# Patient Record
Sex: Male | Born: 1935 | Race: White | Hispanic: No | Marital: Married | Smoking: Former smoker
Health system: Southern US, Community
[De-identification: ages and names within clinical notes are randomized; demographics above are authoritative.]

## PROBLEM LIST (undated history)

## (undated) DIAGNOSIS — F329 Major depressive disorder, single episode, unspecified: Secondary | ICD-10-CM

## (undated) DIAGNOSIS — F32A Depression, unspecified: Secondary | ICD-10-CM

## (undated) DIAGNOSIS — I10 Essential (primary) hypertension: Secondary | ICD-10-CM

## (undated) DIAGNOSIS — I714 Abdominal aortic aneurysm, without rupture, unspecified: Secondary | ICD-10-CM

## (undated) DIAGNOSIS — I2510 Atherosclerotic heart disease of native coronary artery without angina pectoris: Secondary | ICD-10-CM

## (undated) DIAGNOSIS — F0390 Unspecified dementia without behavioral disturbance: Secondary | ICD-10-CM

## (undated) DIAGNOSIS — E785 Hyperlipidemia, unspecified: Secondary | ICD-10-CM

## (undated) HISTORY — DX: Atherosclerotic heart disease of native coronary artery without angina pectoris: I25.10

## (undated) HISTORY — DX: Abdominal aortic aneurysm, without rupture: I71.4

## (undated) HISTORY — PX: APPENDECTOMY: SHX54

## (undated) HISTORY — DX: Essential (primary) hypertension: I10

## (undated) HISTORY — DX: Abdominal aortic aneurysm, without rupture, unspecified: I71.40

## (undated) HISTORY — DX: Hyperlipidemia, unspecified: E78.5

---

## 1992-07-06 HISTORY — PX: CORONARY ARTERY BYPASS GRAFT: SHX141

## 2004-10-06 HISTORY — PX: ESOPHAGOGASTRODUODENOSCOPY: SHX1529

## 2004-10-06 HISTORY — PX: COLONOSCOPY: SHX174

## 2004-12-03 ENCOUNTER — Encounter (INDEPENDENT_AMBULATORY_CARE_PROVIDER_SITE_OTHER): Payer: Self-pay | Admitting: *Deleted

## 2004-12-03 ENCOUNTER — Ambulatory Visit (HOSPITAL_COMMUNITY): Admission: RE | Admit: 2004-12-03 | Discharge: 2004-12-03 | Payer: Self-pay | Admitting: Gastroenterology

## 2005-04-17 ENCOUNTER — Ambulatory Visit: Payer: Self-pay | Admitting: Cardiology

## 2005-04-25 ENCOUNTER — Ambulatory Visit: Payer: Self-pay

## 2005-04-25 ENCOUNTER — Ambulatory Visit: Payer: Self-pay | Admitting: Cardiology

## 2005-06-11 ENCOUNTER — Ambulatory Visit: Payer: Self-pay

## 2005-07-29 ENCOUNTER — Ambulatory Visit: Payer: Self-pay | Admitting: Family Medicine

## 2005-10-24 ENCOUNTER — Ambulatory Visit: Payer: Self-pay | Admitting: Cardiology

## 2006-03-31 ENCOUNTER — Ambulatory Visit: Payer: Self-pay | Admitting: Cardiology

## 2006-04-22 ENCOUNTER — Ambulatory Visit: Payer: Self-pay | Admitting: Cardiology

## 2006-05-05 ENCOUNTER — Ambulatory Visit: Payer: Self-pay | Admitting: Cardiology

## 2006-06-17 ENCOUNTER — Ambulatory Visit: Payer: Self-pay

## 2006-08-13 ENCOUNTER — Ambulatory Visit: Payer: Self-pay | Admitting: Physician Assistant

## 2006-11-04 ENCOUNTER — Ambulatory Visit: Payer: Self-pay | Admitting: Cardiology

## 2006-11-06 ENCOUNTER — Ambulatory Visit: Payer: Self-pay | Admitting: Cardiology

## 2006-11-06 LAB — CONVERTED CEMR LAB
ALT: 25 units/L (ref 0–40)
Alkaline Phosphatase: 47 units/L (ref 39–117)
BUN: 12 mg/dL (ref 6–23)
CO2: 31 meq/L (ref 19–32)
Calcium: 9.5 mg/dL (ref 8.4–10.5)
GFR calc Af Amer: 93 mL/min
GFR calc non Af Amer: 77 mL/min
LDL Cholesterol: 60 mg/dL (ref 0–99)
Total CHOL/HDL Ratio: 2.7

## 2007-02-08 ENCOUNTER — Ambulatory Visit: Payer: Self-pay | Admitting: Family Medicine

## 2007-05-20 ENCOUNTER — Ambulatory Visit: Payer: Self-pay | Admitting: Cardiology

## 2007-05-20 LAB — CONVERTED CEMR LAB
Albumin: 4 g/dL (ref 3.5–5.2)
LDL Cholesterol: 63 mg/dL (ref 0–99)
Total CHOL/HDL Ratio: 2.5
Triglycerides: 80 mg/dL (ref 0–149)
VLDL: 16 mg/dL (ref 0–40)

## 2007-06-17 ENCOUNTER — Ambulatory Visit: Payer: Self-pay

## 2007-06-17 ENCOUNTER — Emergency Department (HOSPITAL_COMMUNITY): Admission: EM | Admit: 2007-06-17 | Discharge: 2007-06-17 | Payer: Self-pay | Admitting: Emergency Medicine

## 2007-10-27 ENCOUNTER — Ambulatory Visit: Payer: Self-pay | Admitting: Cardiology

## 2008-02-02 ENCOUNTER — Ambulatory Visit: Payer: Self-pay | Admitting: Cardiology

## 2008-08-03 ENCOUNTER — Ambulatory Visit: Payer: Self-pay | Admitting: Cardiology

## 2009-01-03 ENCOUNTER — Ambulatory Visit: Payer: Self-pay | Admitting: Cardiology

## 2009-04-06 ENCOUNTER — Telehealth: Payer: Self-pay | Admitting: Cardiology

## 2009-06-04 ENCOUNTER — Telehealth (INDEPENDENT_AMBULATORY_CARE_PROVIDER_SITE_OTHER): Payer: Self-pay

## 2009-06-13 ENCOUNTER — Telehealth (INDEPENDENT_AMBULATORY_CARE_PROVIDER_SITE_OTHER): Payer: Self-pay | Admitting: *Deleted

## 2009-06-14 ENCOUNTER — Encounter: Payer: Self-pay | Admitting: Cardiology

## 2009-06-14 ENCOUNTER — Encounter (INDEPENDENT_AMBULATORY_CARE_PROVIDER_SITE_OTHER): Payer: Self-pay | Admitting: *Deleted

## 2009-06-14 ENCOUNTER — Ambulatory Visit: Payer: Self-pay

## 2009-06-14 DIAGNOSIS — I714 Abdominal aortic aneurysm, without rupture, unspecified: Secondary | ICD-10-CM | POA: Insufficient documentation

## 2009-07-12 ENCOUNTER — Encounter: Payer: Self-pay | Admitting: Cardiology

## 2009-11-20 ENCOUNTER — Encounter: Payer: Self-pay | Admitting: Cardiology

## 2009-12-12 ENCOUNTER — Encounter: Payer: Self-pay | Admitting: Cardiology

## 2009-12-13 ENCOUNTER — Ambulatory Visit: Payer: Self-pay

## 2009-12-13 ENCOUNTER — Encounter: Payer: Self-pay | Admitting: Cardiology

## 2010-06-19 ENCOUNTER — Encounter: Payer: Self-pay | Admitting: Cardiology

## 2010-06-20 ENCOUNTER — Encounter: Payer: Self-pay | Admitting: Cardiology

## 2010-06-20 ENCOUNTER — Ambulatory Visit: Payer: Self-pay

## 2010-10-21 ENCOUNTER — Encounter: Payer: Self-pay | Admitting: Cardiology

## 2010-10-23 ENCOUNTER — Encounter: Payer: Self-pay | Admitting: Cardiology

## 2010-10-23 ENCOUNTER — Ambulatory Visit
Admission: RE | Admit: 2010-10-23 | Discharge: 2010-10-23 | Payer: Self-pay | Source: Home / Self Care | Attending: Cardiology | Admitting: Cardiology

## 2010-10-23 DIAGNOSIS — I2510 Atherosclerotic heart disease of native coronary artery without angina pectoris: Secondary | ICD-10-CM | POA: Insufficient documentation

## 2010-10-23 DIAGNOSIS — E785 Hyperlipidemia, unspecified: Secondary | ICD-10-CM | POA: Insufficient documentation

## 2010-11-05 NOTE — Miscellaneous (Signed)
Summary: Orders Update  Clinical Lists Changes  Orders: Added new Test order of Abdominal Aorta Duplex (Abd Aorta Duplex) - Signed 

## 2010-11-07 NOTE — Miscellaneous (Signed)
  Clinical Lists Changes  Observations: Added new observation of ABDOM US: Stable dimensions of the juxtarenal abdominal aortic aneurysm at 4.5cm X 4.7cm Stable dimensions of the common iliac arteries  f/u 6 months (06/20/2010 14:55) Added new observation of NUCLEAR NOS: Exercise Capacity: Good exercise capacity. BP Response: Normal blood pressure response. Clinical Symptoms: No chest pain ECG Impression: No significant ST segment change suggestive of ischemia. Frequent PVCs.  Overall Impression: Small fixed apical perfusion defect represents either a small area of infarction or apical thinning.  No evidence for ischemia.  Low risk study.    (06/14/2009 14:56)      [place] of Abdomen  Procedure date:  06/20/2010  Findings:      Stable dimensions of the juxtarenal abdominal aortic aneurysm at 4.5cm X 4.7cm Stable dimensions of the common iliac arteries  f/u 6 months  Nuclear Study  Procedure date:  06/14/2009  Findings:      Exercise Capacity: Good exercise capacity. BP Response: Normal blood pressure response. Clinical Symptoms: No chest pain ECG Impression: No significant ST segment change suggestive of ischemia. Frequent PVCs.  Overall Impression: Small fixed apical perfusion defect represents either a small area of infarction or apical thinning.  No evidence for ischemia.  Low risk study.

## 2010-11-07 NOTE — Assessment & Plan Note (Signed)
Summary: Juan Patel   Visit Type:  Follow-up Primary Provider:  Dr. Ardeen Garland  CC:  CAD and AAA.  History of Present Illness: The patient presents for follow up.  Since I last saw him he has had no new cardiovascular complaints.  He is not exercising as frequently as I would hope.  He denies any chest pain, neck or arm pain. He is not having any palpitations, presyncope or syncope. He is not having any PND or orthopnea. He has had followup of his abdominal aortic aneurysm which is now being and every 6 months.  Current Medications (verified): 1)  Metoprolol Tartrate 50 Mg Tabs (Metoprolol Tartrate) .... Take 1 Tablet By Mouth Twice A Day 2)  Lipitor 10 Mg Tabs (Atorvastatin Calcium) .Marland Kitchen.. 1 By Mouth Daily 3)  Lexapro 20 Mg Tabs (Escitalopram Oxalate) .Marland Kitchen.. 1 By Mouth Daily 4)  Multivitamins   Tabs (Multiple Vitamin) .Marland Kitchen.. 1 By Mouth Daily 5)  Aspirin 81 Mg  Tabs (Aspirin) .Marland Kitchen.. 1 By Mouth Daily  Allergies (verified): No Known Drug Allergies  Past History:  Past Medical History: 1. Coronary artery disease (status post CABG with RIMA to the right     coronary artery, LIMA to the LAD, SVG to diagonal, and SVG to     circumflex in 1993). 2. Dyslipidemia. 3. Abdominal aortic aneurysm (4.1 x 4.2).   Review of Systems       As stated in the HPI and negative for all other systems.   Vital Signs:  Patient profile:   77 year old male Height:      74 inches Weight:      185 pounds BMI:     23.84 Pulse rate:   77 / minute Resp:     16 per minute BP sitting:   130 / 86  (right arm)  Vitals Entered By: Marrion Coy, CNA (October 23, 2010 1:40 PM)  Physical Exam  General:  Well developed, well nourished, in no acute distress. Head:  normocephalic and atraumatic Eyes:  PERRLA/EOM intact; conjunctiva and lids normal. Mouth:  Teeth, gums and palate normal. Oral mucosa normal. Neck:  Neck supple, no JVD. No masses, thyromegaly or abnormal cervical nodes. Chest Wall:  Well  healed sternotomy scar Lungs:  Clear bilaterally to auscultation and percussion. Heart:  S1-S2 within normal limits, no S3, no S4, 2/6 apical systolic murmur radiating slightly aortic outflow tract, early peaking,  no diastolic murmurs Abdomen:  Bowel sounds positive; abdomen soft and non-tender without masses, organomegaly, or hernias noted. No hepatosplenomegaly. Msk:  Back normal, normal gait. Muscle strength and tone normal. Pulses:  pulses normal in all 4 extremities Extremities:  No clubbing or cyanosis. Neurologic:  Alert and oriented x 3. Skin:  Intact without lesions or rashes. Cervical Nodes:  no significant adenopathy Axillary Nodes:  no significant adenopathy Inguinal Nodes:  no significant adenopathy Psych:  Normal affect.   EKG  Procedure date:  10/23/2010  Findings:      Normal sinus rhythm with premature atrial contractions, axis within normal limits, intervals within normal limits, no acute ST-T wave changes.  Impression & Recommendations:  Problem # 1:  CORONARY ATHEROSCLEROSIS NATIVE CORONARY ARTERY (ICD-414.01) He has as no new symptoms. No change in therapy is indicated.  He will continue with risk reduction. Orders: EKG w/ Interpretation (93000)  Problem # 2:  ABDOMINAL AORTIC ANEURYSM (ICD-441.4) He will be scheduled in March for follow up of this.  Problem # 3:  DYSLIPIDEMIA (ICD-272.4) He is having his  lipids followed by his primary MD.  The goal is an LDL less than 70 and HDL greater than 40.  Patient Instructions: 1)  Your physician recommends that you schedule a follow-up appointment in: 12 months with Dr Antoine Poche in [place] 2)  Your physician recommends that you continue on your current medications as directed. Please refer to the Current Medication list given to you today.

## 2010-12-24 ENCOUNTER — Other Ambulatory Visit: Payer: Self-pay | Admitting: Cardiology

## 2010-12-24 DIAGNOSIS — I714 Abdominal aortic aneurysm, without rupture: Secondary | ICD-10-CM

## 2010-12-25 ENCOUNTER — Encounter (INDEPENDENT_AMBULATORY_CARE_PROVIDER_SITE_OTHER): Payer: Medicare Other | Admitting: Cardiology

## 2010-12-25 ENCOUNTER — Encounter: Payer: Self-pay | Admitting: Cardiology

## 2010-12-25 DIAGNOSIS — I714 Abdominal aortic aneurysm, without rupture: Secondary | ICD-10-CM

## 2010-12-25 DIAGNOSIS — I723 Aneurysm of iliac artery: Secondary | ICD-10-CM

## 2011-01-08 ENCOUNTER — Encounter: Payer: Self-pay | Admitting: Cardiology

## 2011-01-09 ENCOUNTER — Telehealth: Payer: Self-pay | Admitting: *Deleted

## 2011-01-09 NOTE — Telephone Encounter (Signed)
Called pt to review results of Abdominal U/S done 3/21 - which demonstrated his AAA to be stable at 4.6 X 4.5 cm.  Pt aware to repeat in 6 months

## 2011-02-18 NOTE — Assessment & Plan Note (Signed)
[facility]                            CARDIOLOGY OFFICE NOTE   NAME:Juan Patel, Juan Patel                       MRN:          [id number]  DATE:01/03/2009                            DOB:          07/02/1926    PRIMARY CARE PHYSICIAN:  Delaney Meigs, MD   REASON FOR PRESENTATION:  Evaluate the patient with coronary artery  disease.   HISTORY OF PRESENT ILLNESS:  The patient returns for his followup.  It  has been about 15 months.  Since that time, he has done very well.  He  has not exercised quite as much as I would like though he still gets  that on the treadmill a couple of times a week.  With this level of  activity, he denies any chest discomfort, neck, or arm discomfort.  He  has had no palpitations, presyncope, or syncope.  He has had no PND or  orthopnea.  I did review lipid is made available to me by Dr. Lysbeth Galas in  January.  He had a fantastic lipid profile with a total of 120,  triglyceride 118, HDL 49, and LDL 47.  We reviewed his most recent  abdominal aortic aneurysm, which reports 4.1 x 4.2 cm.  He is slightly  overdue for followup.  It had been unchanged, however.   PAST MEDICAL HISTORY:  1. Coronary artery disease (status post CABG with RIMA to the right      coronary artery, LIMA to the LAD, SVG to diagonal, and SVG to      circumflex in 1993).  2. Dyslipidemia.  3. Abdominal aortic aneurysm (4.1 x 4.2).   ALLERGIES:  None.   MEDICATIONS:  1. Lipitor 10 mg daily.  2. Aspirin 81 mg daily.  3. Metoprolol 50 mg b.i.d.  4. Gemfibrozil 600 mg b.i.d.  5. Lexapro 20 mg daily.  6. Flomax 0.4 mg at bedtime.  7. Metformin 250 mg daily.   REVIEW OF SYSTEMS:  As stated in the HPI and otherwise negative for  other systems.   PHYSICAL EXAMINATION:  GENERAL:  The patient is pleasant in no distress.  VITAL SIGNS:  Blood pressure 140/70, heart rate 59 and regular, weight  188 pounds, and body mass index 25.  HEENT:  Eyelids unremarkable.   Pupils equal, round, and reactive to  light.  Fundi not visualized.  Oral mucosa unremarkable.  NECK:  No jugular venous distension at 45 degrees.  Carotid upstroke  brisk and symmetric.  No bruits.  No thyromegaly.  LYMPHATICS:  No cervical, axillary, or inguinal adenopathy.  LUNGS:  Clear to auscultation bilaterally.  BACK:  No costovertebral angle tenderness.  CHEST:  Well-healed sternotomy scar.  HEART:  PMI not displaced or sustained.  S1 and S2 within normal limits.  No S3, no S4.  No clicks, no rubs, no murmurs.  ABDOMEN:  Flat, positive bowel sounds, normal in frequency and pitch.  No bruits, no rebound, no guarding.  No midline pulsatile mass.  No  hepatomegaly.  No splenomegaly.  SKIN:  No rashes, no nodules.  EXTREMITIES:  2+ pulses throughout.  No  edema, no cyanosis, no clubbing.  NEUROLOGIC:  Oriented to person, place, and time.  Cranial nerves II  through XII grossly intact.  Motor grossly intact.   EKG, sinus rhythm, rate 59, axis leftward, intervals within normal  limits, no acute ST-T wave changes.   ASSESSMENT AND PLAN:  1. Coronary artery disease.  The patient is having no symptoms.  He is      undergoing aggressive secondary risk reduction.  However, his      bypass grafts are now 75 years old.  He has not had a stress      perfusion study since 2006.  I think it is reasonable to perform an      exercise perfusion study.  This will be done electively.  He will      continue with the meds as listed.  2. Abdominal aortic aneurysm.  I am going to have him do this on      around September, which will be 1 year from the previous.  He can      have his Cardiolite at the same time.  Further suggestions will be      based on the results of this.  3. Dyslipidemia.  I reviewed the lipids as above.  It is an excellent      profile and he will remain on the meds as listed.  4. Diabetes.  This is followed by Dr. Lysbeth Galas.  He actually had his      dose of metformin reduced and  I suspect he has excellent sugar      control.  He reports the sugars at home are well within normal      limits.  5. Hypertension.  Blood pressure is slightly elevated.  However, it is      otherwise controlled.  He will continue the meds as listed.  6. Followup.  I will see him back in about 18 months or sooner based      on the results of the above studies or any future symptoms.     Rollene Rotunda, MD, [facility]  Electronically Signed    JH/MedQ  DD: 01/03/2009  DT: 01/04/2009  Job #: [id number]   cc:   Delaney Meigs, M.D.

## 2011-02-18 NOTE — Assessment & Plan Note (Signed)
[facility]                            CARDIOLOGY OFFICE NOTE   NAME:Juan Patel, Juan Patel                       MRN:          [id number]  DATE:10/27/2007                            DOB:          [birth year]    PRIMARY CARE PHYSICIAN:  Dr. Ardeen Garland.   REASON FOR PRESENTATION:  Evaluate patient with coronary disease.   HISTORY OF PRESENT ILLNESS:  The patient is a pleasant 77 year old  gentleman with coronary disease, status post CABG as described below.  Since I last saw him, he has done relatively well.  He was diagnosed  with diabetes and went on a diet.  With this, he lost quite a bit of  weight.  He was also having trouble with frequent urination and has been  treated with Flomax.  During that time he stopped his  hydrochlorothiazide.  He has remained off of it, he says his blood  pressure has been excellent.  This may have to do with the weight loss.  Because of his frequent urination he was not exercising as much, but  thinks he will get back to this.  With his level of activity he denies  any chest discomfort, neck or arm discomfort.  He has not been having  any palpitations, presyncope or syncope.  Denies any PND or orthopnea.   PAST MEDICAL HISTORY:  1. Coronary artery disease (status post CABG with a RIMA to the right      coronary artery, LIMA to the LAD, SVG to diagonal and SVG to      circumflex in 1993 - the last stress perfusion study was 2006).  2. Dyslipidemia.  3. Abdominal aortic aneurysm (followed with q. 6 month ultrasound - he      is due again in March - the last measurement was 4x4.2 cm).   ALLERGIES:  None.   MEDICATIONS:  1. Lipitor 10 mg daily.  2. Multivitamin.  3. Lopid 300 mg b.i.d.  4. Toprol 100 mg daily.  5. Aspirin 81 mg daily.  6. Fish oil.  7. Glipizide.  8. Nexium.  9. Flomax.  10.Lexapro 20 mg daily.   REVIEW OF SYSTEMS:  As stated in the HPI and otherwise negative for  other systems.   PHYSICAL  EXAMINATION:  The patient is in no distress.  Blood pressure 130/76, heart rate 60 and regular, body mass index 24,  weight 177 pounds.  HEENT:  Eyelids unremarkable, pupils are equal, round, and reactive to  light, fundi not visualized.  Oral mucosa unremarkable.  NECK:  No jugular venous distension at 45 degrees, carotid upstroke  brisk and symmetrical.  No bruit.  No thyromegaly.  LYMPHATICS:  No cervical, axillary, inguinal adenopathy.  LUNGS:  Clear to auscultation bilaterally.  BACK:  No costovertebral angle tenderness.  CHEST:  Well-healed sternotomy scar.  HEART:  PMI not displaced or sustained.  S1 and S2 are within normal  limits.  No S3, no S4.  No clicks, no rubs, no murmurs.  ABDOMEN:  Flat, positive bowel sounds, normal in frequency and pitch.  No bruits, no rebound, no guarding.  No midline pulsatile mass.  No  hepatomegaly, no splenomegaly.  SKIN:  No rashes, no nodules.  EXTREMITIES:  2+ pulses throughout, no edema.   EKG sinus rhythm, rate 60, axis within normal limits, intervals within  normal limits.  No acute ST-T wave changes.   ASSESSMENT AND PLAN:  1. Coronary disease:  The patient is having no symptoms related to      this.  No further cardiovascular testing is suggested.  He will      continue the medications as listed.  2. Abdominal aortic aneurysm:  This is due to be followed up as stated      above.  3. Dyslipidemia:  He had an excellent lipid profile, and is due to      have this followed in February by Dr. Lysbeth Galas.  4. Hypertension:  Blood pressure is actually at an acceptable range      without the hydrochlorothiazide.  Therefore, he will remain on the      medicines as listed.  5. Followup:  I have encouraged him to get back to exercising now that      his urinary frequency is less bothersome.  I will see him back in      12 months or sooner if needed.     Rollene Rotunda, MD, [facility]  Electronically Signed    JH/MedQ  DD: 10/27/2007  DT:  10/27/2007  Job #: [id number]   cc:   Delaney Meigs, M.D.

## 2011-02-21 NOTE — Assessment & Plan Note (Signed)
[facility]                            CARDIOLOGY OFFICE NOTE   NAME:Juan Patel, Juan Patel                       MRN:          [id number]  DATE:11/04/2006                            DOB:          04/05/1926    PRIMARY CARE PHYSICIAN:  Delaney Meigs, M.D.   REASON FOR PRESENTATION:  Evaluate patient with coronary disease.   HISTORY OF PRESENT ILLNESS:  The patient is now 77 years old.  He was  not scheduled to come back until July.  However, he had multiple  questions, given recent articles about statins and Zetia.  He takes  Lipitor.  He has had some problem with impotency, which started shortly  after I prescribed hydrochlorothiazide.  However, this has improved and  currently he does not think it is a big issue.  He does say his blood  pressure is better.  When he is relaxed, it is in the 120s.  He thinks  he averages 130s.  When he is stressed, it may be in the 140s or 150s  systolic.  He has not had any presyncope or syncope.  He denies any  chest discomfort, neck discomfort, arm discomfort, activity-induced  nausea, vomiting, excessive diaphoresis.  He has had no shortness of  breath, PND or orthopnea.   PAST MEDICAL HISTORY:  Coronary artery disease (status post CABG with a  RIMA to the right coronary artery, LIMA to the LAD, SVG to diagonal and  SVG to circumflex in 1993), dyslipidemia, abdominal aortic aneurysm, 3.7  x 3.9.   ALLERGIES:  None.   MEDICATIONS:  1. Lipitor 10 mg daily.  2. Multivitamin.  3. Lopid 200 mg b.i.d.  4. Toprol 100 mg daily.  5. Aspirin 81 mg daily.  6. Fish oil.  7. Hydrochlorothiazide 12.5 mg daily.   REVIEW OF SYSTEMS:  As stated in the HPI, and otherwise negative for  other systems.   PHYSICAL EXAMINATION:  The patient is in no distress.  Blood pressure  148/88, heart rate 66 and regular, weight 191 pounds, body mass index  25.  HEENT:  Eyelids unremarkable.  Pupils equal, round and reactive to  light.   Fundi not visualized.  NECK:  No jugular venous distention at 45 degrees.  Carotid upstroke  brisk and symmetric.  No bruits, no thyromegaly.  LYMPHATICS:  No cervical, axillary or inguinal adenopathy.  LUNGS:  Clear to auscultation bilaterally.  BACK:  No costovertebral angle tenderness.  CHEST:  Unremarkable.  HEART:  PMI not displaced or sustained.  S1 and S2 within normal limits.  No S3, no S4, no murmurs.  ABDOMEN:  Flat, positive bowel sounds, normal in frequency and pitch.  No bruits, rebound, guarding, midline pulsatile masses, no organomegaly.  SKIN:  No rashes, no nodules.  EXTREMITIES:  2+ pulses throughout, no edema, no cyanosis or clubbing.  NEUROLOGIC:  Oriented to person, place and time.  Cranial nerves II  through XII grossly intact.  Motor grossly intact.   EKG:  Sinus rhythm with premature atrial contractions and premature  ventricular contractions, axis within normal limits, intervals within  normal  limits, no acute STT-wave changes.   ASSESSMENT AND PLAN:  1. Coronary disease:  The patient is having no symptoms related to      this.  I spent a long time (greater than half of this appointment)      discussing statins and data.  He understands and asked excellent      questions.  He agrees to continue with his current regimen.  He has      had liver enzymes and lipids checked and they have been at an      acceptable target.  His liver enzymes have not been elevated.  2. Carotid plaques/abdominal aortic aneurysm:  He is due to have      followup for this in September.  3. Dyslipidemia, as above.  4. Hypertension:  I think his blood pressure is probably at an      acceptable range, on average.  Because I think he would have more      side effects if I tried to increase doses, I will leave things as      they are.  5. Followup:  I will see him back in September of next year.  He is      going to get a BMET again and followup of his meds.     Rollene Rotunda, MD,  [facility]  Electronically Signed    JH/MedQ  DD: 11/04/2006  DT: 11/04/2006  Job #: [id number]   cc:   Delaney Meigs, M.D.

## 2011-02-21 NOTE — Op Note (Signed)
NAME:  Juan Patel, Juan Patel                ACCOUNT NO.:  [id number]   MEDICAL RECORD NO.:  [id number]          PATIENT TYPE:  AMB   LOCATION:  ENDO                         FACILITY:  [facility]   PHYSICIAN:  Bernette Redbird, M.D.   DATE OF BIRTH:  03/10/1926   DATE OF PROCEDURE:  12/03/2004  DATE OF DISCHARGE:                                 OPERATIVE REPORT   PROCEDURE:  Upper endoscopy.   INDICATION:  Intensifying reflux symptoms in a 77 year old gentleman.   FINDINGS:  Duodenal erosions consistent with aspirin. Small hiatal hernia.   PROCEDURE:  The nature, purpose and risks of the procedure had been  discussed with the patient who provided written consent. Sedation for this  procedure and the colonoscopy which followed it totaled fentanyl 70 mcg and  Versed 7 milligrams IV without arrhythmias or desaturation. The Olympus  small-caliber adult video endoscope was passed under direct vision. The  vocal cords looked normal and the esophagus was readily entered and was  normal in its entirety, specifically without evidence of free reflux, reflux  esophagitis, Barrett's esophagus, varices, infection or neoplasia. A small 2  cm hiatal hernia was present with a widely patent esophageal mucosal ring at  the squamocolumnar junction.   The stomach contained no significant residual and had essentially normal  mucosa without evidence of gastritis, erosions, ulcers, polyps or masses  including a retroflexed view of the cardia which was essentially normal.   The pylorus and duodenal bulb looked normal but the second duodenum had a  focal erosion on a fold, probably secondary to aspirin exposure. No frank  ulceration was seen.   The scope was removed from the patient. No biopsies were obtained. The  patient tolerated the procedure well and there no apparent complications.   IMPRESSION:  1.  Reflux symptoms, without worrisome findings on current examination      (409811).  2.  Small hiatal hernia with  esophageal ring.  3.  Duodenal erosion.   PLAN:  P.r.n. use of H2 blockers and/or PPIs as needed to control symptoms.      RB/MEDQ  D:  12/03/2004  T:  12/03/2004  Job:  [id number]   cc:   Delaney Meigs, M.D.  [address]  [place]  [place] [address]  Fax: [phone]

## 2011-02-21 NOTE — Op Note (Signed)
NAME:  Juan Patel, Juan Patel                ACCOUNT NO.:  [id number]   MEDICAL RECORD NO.:  [id number]          PATIENT TYPE:  AMB   LOCATION:  ENDO                         FACILITY:  [facility]   PHYSICIAN:  Bernette Redbird, M.D.   DATE OF BIRTH:  12/06/1925   DATE OF PROCEDURE:  12/03/2004  DATE OF DISCHARGE:                                 OPERATIVE REPORT   PROCEDURE:  Colonoscopy with biopsy.   INDICATIONS:  A 77 year old gentleman for first ever screening colonoscopy.  No worrisome risk factors or symptoms.   FINDINGS:  Diminutive cecal polyp.   PROCEDURE:  The nature, purpose, risks the procedure had been discussed with  the patient, who provided written consent. Sedation for this procedure. The  upper endoscopy which preceded it totaled fentanyl 70 mcg and Versed 7  milligrams IV without arrhythmias or desaturation. Digital exam of the  prostate showed a fairly flat prostate bed, no discrete nodules. The Olympus  video colonoscope was advanced to the cecum as identified by clear  visualization of the appendiceal orifice and the absence of further lumen.  Adjacent to the appendiceal orifice was a diminutive 2 mm hyperplastic  appearing polyp removed by a single cold biopsy. No other polyps were seen  and there was no evidence of cancer, colitis, vascular malformations or  significant diverticular disease that I recall. Retroflexion the rectum was  unremarkable as was reinspection of the rectum by recollection.   There were no apparent complications. The patient tolerated the procedure  well.   IMPRESSION:  1.  Diminutive cecal polyp (2113).  2.  Otherwise unremarkable screening colonoscopy.   Plan await pathology of polyp with consideration for follow-up colonoscopy  in 4 to 5 years if it is adenomatous in character. Otherwise, consideration  might be given to flexible sigmoidoscopy in 5 years for continued colon  cancer screening.      RB/MEDQ  D:  12/03/2004  T:  12/03/2004   Job:  [id number]   cc:   Delaney Meigs, M.D.  [address]  [place]  [place] [address]  Fax: [phone]

## 2011-02-21 NOTE — Assessment & Plan Note (Signed)
[facility]                              CARDIOLOGY OFFICE NOTE   NAME:Juan Patel, Juan Patel                       MRN:          [id number]  DATE:04/22/2006                            DOB:          [birth year]    PRIMARY CARE PHYSICIAN:  Delaney Meigs, MD   REASON FOR PRESENTATION:  Patient with coronary disease.   HISTORY OF PRESENT ILLNESS:  Patient is now 77 years old.  He has done well  since I last saw him.  He has had bypass surgery in 1993.  He had a negative  stress perfusion study last year with a well preserved ejection fraction.   In the past year, he has done well.  He exercises about three times a week.  He denies any chest discomfort, neck discomfort, arm discomfort, activity-  induced nausea or vomiting, excessive diaphoresis.  He has had no  palpitations, presyncope, or syncope.  He denies any PND or orthopnea.   Of note, his blood pressure is slightly elevated today.  I reviewed records  and asked him, and he says his systolic is typically above 140 in the 150s  often.   PAST MEDICAL HISTORY:  1.  Coronary artery disease (status post CABG with RIMA to the right      coronary artery, LIMA to the LAD, SVG to diagonal, and SVG to circumflex      in 1993).  2.  Hyperlipidemia.  3.  Abdominal aortic aneurysm, 3.7 x 3.9, minimal carotid plaque.   ALLERGIES:  None.   MEDICATIONS:  1.  Lipitor 10 mg daily.  2.  Multivitamin.  3.  Lopid 200 mg b.i.d.  4.  Toprol XL 100 mg daily.  5.  Aspirin 81 mg daily.  6.  Fish oil.   REVIEW OF SYSTEMS:  As stated in the HPI, otherwise negative for other  systems.   PHYSICAL EXAMINATION:  VITAL SIGNS:  Blood pressure 154/88, heart rate 71  and regular, weight 191 pounds.  Body Mass Index 25.  GENERAL:  Patient is in no distress.  HEENT:  Eyelids unremarkable, pupils are equal, round and reactive to light,  fundi not visualized, oral mucosa unremarkable.  NECK:  No jugular venous distention.   Wave form within normal limits.  Carotid upstroke brisk and symmetric.  No bruits.  No thyromegaly.  LYMPHATICS:  No cervical, axillary, inguinal adenopathy.  LUNGS:  Clear to auscultation bilaterally.  BACK:  No costovertebral angle tenderness.  CHEST:  Unremarkable.  HEART:  PMI not displaced or sustained, S1 and S2 within normal limits, no  S3, no S4, no murmurs.  ABDOMEN:  Flat, positive bowel sounds.  Normal in frequency and pitch.  No  bruits, rebound, guarding.  There are no midline pulsatile masses.  No  organomegaly.  SKIN:  No rashes, no nodules.  EXTREMITIES:  Pulses 2+ throughout, no cyanosis, no clubbing, no edema.  NEUROLOGIC:  Alert and oriented to person, place, and time.  Cranial nerves  II-XII grossly intact.  Motor grossly intact throughout.   EKG:  Sinus rhythm, rate 71, axis within normal  limits, intervals within  normal limits.  Premature atrial contractions.  No acute ST-T wave changes.   LABS:  Cholesterol 135, HDL 42.8, LDL 73, triglycerides 96.   ASSESSMENT/PLAN:  1.  Coronary disease:  Patient is having no new symptoms.  He has been      getting along very well over the past year.  I encouraged more exercise.      He has not indication for further cardiovascular study at this point.      He will continue aggressive risk reduction.  2.  Carotid plaque/abdominal aortic aneurysm:  He will have repeat, as      suggested in September.  His abdominal aneurysm was increasing slightly,      and we will keep an eye on this.  3.  Dyslipidemia:  He has the results as described above.  He also had      normal liver enzymes.  He will continue on the current regimen.  4.  Hypertension:  Blood pressure is not at target.  Therefore, I took the      liberty of adding hydrochlorothiazide 12.5 mg daily.  He will get a BMET      in about 10 days.  He will increase citrus fruits and bananas.  5.  Followup:  I will see him back in one year or sooner if needed.                                Rollene Rotunda, MD, [facility]    JH/MedQ  DD:  04/22/2006  DT:  04/22/2006  Job #:  [id number]   cc:   Delaney Meigs, MD

## 2011-05-15 ENCOUNTER — Encounter: Payer: Self-pay | Admitting: Cardiology

## 2011-05-22 ENCOUNTER — Other Ambulatory Visit (HOSPITAL_COMMUNITY): Payer: Self-pay | Admitting: Family Medicine

## 2011-05-22 DIAGNOSIS — G459 Transient cerebral ischemic attack, unspecified: Secondary | ICD-10-CM

## 2011-05-26 ENCOUNTER — Encounter (HOSPITAL_COMMUNITY): Payer: Self-pay

## 2011-05-26 ENCOUNTER — Ambulatory Visit (HOSPITAL_COMMUNITY)
Admission: RE | Admit: 2011-05-26 | Discharge: 2011-05-26 | Disposition: A | Discharge status: Home / Self Care | Payer: Medicare Other | Source: Ambulatory Visit | Attending: Family Medicine | Admitting: Family Medicine

## 2011-05-26 DIAGNOSIS — I672 Cerebral atherosclerosis: Secondary | ICD-10-CM | POA: Insufficient documentation

## 2011-05-26 DIAGNOSIS — F29 Unspecified psychosis not due to a substance or known physiological condition: Secondary | ICD-10-CM | POA: Insufficient documentation

## 2011-05-26 DIAGNOSIS — R9409 Abnormal results of other function studies of central nervous system: Secondary | ICD-10-CM | POA: Insufficient documentation

## 2011-05-26 DIAGNOSIS — G459 Transient cerebral ischemic attack, unspecified: Secondary | ICD-10-CM

## 2011-05-28 ENCOUNTER — Ambulatory Visit (HOSPITAL_COMMUNITY): Payer: Medicare Other

## 2011-06-03 ENCOUNTER — Telehealth: Payer: Self-pay | Admitting: Cardiology

## 2011-06-03 NOTE — Telephone Encounter (Signed)
Needs to review with primary MD and might need a neurology appt.

## 2011-06-03 NOTE — Telephone Encounter (Signed)
Pt wants to talk to pam re his radiology report.

## 2011-06-03 NOTE — Telephone Encounter (Signed)
Spoke with pt who states he had an episode where after taking a nap - he woke and per his wife was very confused.  EMS was called and they wanted him to go to the ED however the pt refused.  He saw Dr Lysbeth Galas the next day who ordered a CT of the head, carotid doppler and 2 D Echo.  He went for the CT but cancelled the carotid doppler and echo.  He has not heard about the CT results and wanted to see Dr Antoine Poche had reviewed it.  Explained to pt that the results would go back to the MD who ordered the CT.   Prior to speaking to pt, the wife discussed with me at length about how the pt was incoherent after waking from a nap. She states that he did not have any trouble smiling and no weakness or numbness on either side and no slurred speech.  His speech was repetitive and "he was just saying words that didn't go together".  She reports that he has been increasingly more forgetful, unreasonable and sleeping a great deal.  Wife states he sleeps 10 to 11 hours a night and naps about 3 times a day.  She did not want the pt to know that she spoke with me about the pts problems. Will forward information to Dr Antoine Poche for his review and recommendations.

## 2011-06-04 NOTE — Telephone Encounter (Signed)
PT AWARE OF RECOMMENDATIONS TO FOLLOW UP WITH DR Lysbeth Galas.  PT WILL FOLLOW UP WITH THEM.

## 2011-07-04 ENCOUNTER — Ambulatory Visit (INDEPENDENT_AMBULATORY_CARE_PROVIDER_SITE_OTHER): Payer: Medicare Other | Admitting: Cardiology

## 2011-07-04 ENCOUNTER — Encounter: Payer: Self-pay | Admitting: Cardiology

## 2011-07-04 DIAGNOSIS — R011 Cardiac murmur, unspecified: Secondary | ICD-10-CM | POA: Insufficient documentation

## 2011-07-04 DIAGNOSIS — I10 Essential (primary) hypertension: Secondary | ICD-10-CM | POA: Insufficient documentation

## 2011-07-04 DIAGNOSIS — I2510 Atherosclerotic heart disease of native coronary artery without angina pectoris: Secondary | ICD-10-CM

## 2011-07-04 DIAGNOSIS — F29 Unspecified psychosis not due to a substance or known physiological condition: Secondary | ICD-10-CM

## 2011-07-04 DIAGNOSIS — R413 Other amnesia: Secondary | ICD-10-CM

## 2011-07-04 DIAGNOSIS — G459 Transient cerebral ischemic attack, unspecified: Secondary | ICD-10-CM

## 2011-07-04 DIAGNOSIS — I714 Abdominal aortic aneurysm, without rupture: Secondary | ICD-10-CM

## 2011-07-04 DIAGNOSIS — E785 Hyperlipidemia, unspecified: Secondary | ICD-10-CM

## 2011-07-04 DIAGNOSIS — R410 Disorientation, unspecified: Secondary | ICD-10-CM | POA: Insufficient documentation

## 2011-07-04 HISTORY — DX: Essential (primary) hypertension: I10

## 2011-07-04 NOTE — Assessment & Plan Note (Signed)
The blood pressure is at target. No change in medications is indicated. We will continue with therapeutic lifestyle changes (TLC).  

## 2011-07-04 NOTE — Assessment & Plan Note (Signed)
He is due for follow up of this in October.

## 2011-07-04 NOTE — Assessment & Plan Note (Signed)
The patient has no new sypmtoms.  No further cardiovascular testing is indicated.  We will continue with aggressive risk reduction and meds as listed.  He had a low risk stress test in 9/10.

## 2011-07-04 NOTE — Patient Instructions (Signed)
Your physician has requested that you have a carotid duplex. This test is an ultrasound of the carotid arteries in your neck. It looks at blood flow through these arteries that supply the brain with blood. Allow one hour for this exam. There are no restrictions or special instructions.  Your physician has requested that you have an echocardiogram. Echocardiography is a painless test that uses sound waves to create images of your heart. It provides your doctor with information about the size and shape of your heart and how well your heart's chambers and valves are working. This procedure takes approximately one hour. There are no restrictions for this procedure.  Your physician has requested that you have an abdominal aorta duplex. During this test, an ultrasound is used to evaluate the aorta. Allow 30 minutes for this exam. Do not eat after midnight the day before and avoid carbonated beverages  Continue medications as listed.  Follow up in [place] after all testing is completed.

## 2011-07-04 NOTE — Progress Notes (Signed)
HPI The patient presents for followup of his coronary artery disease. His wife reports that in August he had an episode of confusion and difficulty finding words. He called 911. He refused admission to the hospital. His symptoms subsequently resolved over a few hours. Head CT done in August demonstrated advanced white matter disease suggestive of a vascular etiology. He is scheduled to have carotids. His wife reports that he's had increasing confusion with activity such as driving recently. She has taken over this job. He has had some leg weakness with activities and has not been as active as previous.  The patient denies any new symptoms such as chest discomfort, neck or arm discomfort. There has been no new shortness of breath, PND or orthopnea. There have been no reported palpitations, presyncope or syncope.  She does report increased sleeping.  There have been no complaints of decreased vision or motor activity.  No Known Allergies  Current Outpatient Prescriptions  Medication Sig Dispense Refill  . aspirin 325 MG tablet Take 325 mg by mouth daily.        Marland Kitchen atorvastatin (LIPITOR) 10 MG tablet Take 10 mg by mouth daily.        Marland Kitchen escitalopram (LEXAPRO) 20 MG tablet Take 20 mg by mouth daily.        . metoprolol (LOPRESSOR) 50 MG tablet Take 50 mg by mouth 2 (two) times daily.        . Multiple Vitamin (MULTIVITAMIN) tablet Take 1 tablet by mouth daily.          Past Medical History  Diagnosis Date  . CAD (coronary artery disease)     S/P CABG   . Dyslipidemia   . Abdominal aortic aneurysm     4.1 x 4.2  . Diabetes mellitus     Past Surgical History  Procedure Date  . Coronary artery bypass graft 1993    RIMA to the RCA, LIMA to the LAD, SVG to diagonal, and SVG to circumflex    ROS:  As stated in the HPI and negative for all other systems.  PHYSICAL EXAM BP 150/74  Pulse 60  Ht 6\' 2"  (1.88 m)  Wt 186 lb (84.369 kg)  BMI 23.88 kg/m2 GENERAL:  Well appearing HEENT:  Pupils  equal round and reactive, fundi not visualized, oral mucosa unremarkable NECK:  No jugular venous distention, waveform within normal limits, carotid upstroke brisk and symmetric, no bruits, no thyromegaly LYMPHATICS:  No cervical, inguinal adenopathy LUNGS:  Clear to auscultation bilaterally BACK:  No CVA tenderness CHEST:  Well healed sternotomy scar. HEART:  PMI not displaced or sustained,S1 and S2 within normal limits, no S3, no S4, no clicks, no rubs, apical systolic murmur early peaking ABD:  Flat, positive bowel sounds normal in frequency in pitch, no bruits, no rebound, no guarding, no midline pulsatile mass, no hepatomegaly, no splenomegaly EXT:  2 plus pulses throughout, no edema, no cyanosis no clubbing SKIN:  No rashes no nodules NEURO:  Cranial nerves II through XII grossly intact, motor grossly intact throughout PSYCH:  Cognitively intact, oriented to person place and time  EKG:  NSR, rate 60, axis WNL, no acute ST T wave changes.  ASSESSMENT AND PLAN

## 2011-07-04 NOTE — Assessment & Plan Note (Signed)
I will defer follow up to Josue Hector, MD

## 2011-07-04 NOTE — Assessment & Plan Note (Signed)
I will order an echo to evaluate this.

## 2011-07-04 NOTE — Assessment & Plan Note (Signed)
He might be having vascular dementia.  I will check carotid dopplers.  I will facilitate a neurology appt.

## 2011-07-10 ENCOUNTER — Encounter: Payer: Self-pay | Admitting: *Deleted

## 2011-07-18 LAB — RAPID URINE DRUG SCREEN, HOSP PERFORMED
Amphetamines: NOT DETECTED
Barbiturates: NOT DETECTED
Benzodiazepines: NOT DETECTED
Tetrahydrocannabinol: NOT DETECTED

## 2011-07-18 LAB — DIFFERENTIAL
Eosinophils Absolute: 0.2
Eosinophils Relative: 2
Lymphocytes Relative: 24
Lymphs Abs: 2.6
Monocytes Relative: 8

## 2011-07-18 LAB — CBC
HCT: 43
MCHC: 34.5
MCV: 98.8
Platelets: 312
RDW: 12.3

## 2011-07-18 LAB — BASIC METABOLIC PANEL
BUN: 13
Chloride: 94 — ABNORMAL LOW
GFR calc non Af Amer: >60
Glucose, Bld: 94
Potassium: 4.5

## 2011-07-23 ENCOUNTER — Other Ambulatory Visit: Payer: Self-pay | Admitting: Cardiology

## 2011-07-23 DIAGNOSIS — I714 Abdominal aortic aneurysm, without rupture: Secondary | ICD-10-CM

## 2011-07-24 ENCOUNTER — Encounter: Payer: Self-pay | Admitting: Cardiology

## 2011-07-24 ENCOUNTER — Encounter (INDEPENDENT_AMBULATORY_CARE_PROVIDER_SITE_OTHER): Payer: Medicare Other | Admitting: Cardiology

## 2011-07-24 ENCOUNTER — Encounter: Payer: Self-pay | Admitting: Neurology

## 2011-07-24 ENCOUNTER — Ambulatory Visit (INDEPENDENT_AMBULATORY_CARE_PROVIDER_SITE_OTHER): Payer: Medicare Other | Admitting: Neurology

## 2011-07-24 ENCOUNTER — Ambulatory Visit (HOSPITAL_COMMUNITY): Payer: Medicare Other | Attending: Cardiology

## 2011-07-24 DIAGNOSIS — E785 Hyperlipidemia, unspecified: Secondary | ICD-10-CM | POA: Insufficient documentation

## 2011-07-24 DIAGNOSIS — I079 Rheumatic tricuspid valve disease, unspecified: Secondary | ICD-10-CM | POA: Insufficient documentation

## 2011-07-24 DIAGNOSIS — R930 Abnormal findings on diagnostic imaging of skull and head, not elsewhere classified: Secondary | ICD-10-CM

## 2011-07-24 DIAGNOSIS — I714 Abdominal aortic aneurysm, without rupture: Secondary | ICD-10-CM

## 2011-07-24 DIAGNOSIS — R413 Other amnesia: Secondary | ICD-10-CM

## 2011-07-24 DIAGNOSIS — F29 Unspecified psychosis not due to a substance or known physiological condition: Secondary | ICD-10-CM

## 2011-07-24 DIAGNOSIS — R011 Cardiac murmur, unspecified: Secondary | ICD-10-CM

## 2011-07-24 DIAGNOSIS — I723 Aneurysm of iliac artery: Secondary | ICD-10-CM

## 2011-07-24 DIAGNOSIS — I379 Nonrheumatic pulmonary valve disorder, unspecified: Secondary | ICD-10-CM | POA: Insufficient documentation

## 2011-07-24 DIAGNOSIS — G459 Transient cerebral ischemic attack, unspecified: Secondary | ICD-10-CM

## 2011-07-24 DIAGNOSIS — E119 Type 2 diabetes mellitus without complications: Secondary | ICD-10-CM | POA: Insufficient documentation

## 2011-07-24 DIAGNOSIS — R410 Disorientation, unspecified: Secondary | ICD-10-CM

## 2011-07-24 DIAGNOSIS — I6529 Occlusion and stenosis of unspecified carotid artery: Secondary | ICD-10-CM

## 2011-07-24 DIAGNOSIS — I2510 Atherosclerotic heart disease of native coronary artery without angina pectoris: Secondary | ICD-10-CM

## 2011-07-24 DIAGNOSIS — I10 Essential (primary) hypertension: Secondary | ICD-10-CM | POA: Insufficient documentation

## 2011-07-24 NOTE — Patient Instructions (Addendum)
Your MRI and MRA's are scheduled for Tuesday, October 23rd at 5:00pm.  Please arrive to [facility] MRI by 4:45pm.

## 2011-07-24 NOTE — Progress Notes (Signed)
Dear Dr. Lysbeth Galas,  Thank you for having me see Juan Patel in consultation today at [facility] for his problem with a transient spell of difficulty speaking and confusion.  As you may recall, he is a 77 y.o. year old male with a history of HTN, CAD, who presents with a spell of non-sensical speech that went on for about 1 hour.  It occurred on August 14th soon after he awoke from a nap.  He did not have any obvious focal symptoms other than the confusion and dysphasia.  He has not had another spell like this before.  EMS came and he refused to go to be evaluated.  Since that time he has not had another spell.  He does have baseline memory problems.  He sometimes has word finding difficulties.  He has been getting confused while he has been driving.  His wife is concerned particularly about his driving, and she has currently been doing much of it.  He denies hallucinations, changes in his bladder function or significant changes in his gait.  He has had two falls, both appear to be where he lost his footing.  Past Medical History  Diagnosis Date  . CAD (coronary artery disease)     S/P CABG   . Dyslipidemia   . Abdominal aortic aneurysm     4.1 x 4.2  . Diabetes mellitus   . HTN (hypertension) 07/04/2011    Past Surgical History  Procedure Date  . Coronary artery bypass graft 1993    RIMA to the RCA, LIMA to the LAD, SVG to diagonal, and SVG to circumflex    History   Social History  . Marital Status: Married    Spouse Name: N/A    Number of Children: N/A  . Years of Education: N/A   Social History Main Topics  . Smoking status: Former Smoker -- 0.5 packs/day    Types: Cigarettes    Quit date: 10/26/1961  . Smokeless tobacco: Never Used  . Alcohol Use: None  . Drug Use: None  . Sexually Active: None   Other Topics Concern  . None   Social History Narrative  . None    No family history of seizures or dementia.    ROS:  13 systems were reviewed and are notable  for difficulty driving.  All other review of systems are unremarkable.   Examination:  Filed Vitals:   07/24/11 1153  BP: 142/80  Pulse: 60  Height: 6\' 1"  (1.854 m)  Weight: 185 lb (83.915 kg)     In general, thin appearing older man.  Cardiovascular: The patient has a regular rate and rhythm and no carotid bruits.  Fundoscopy:  Disks are flat. Vessel caliber within normal limits.  Mental status:   MMSE 25/30 2 lost for 3 word recall, 3 for spelling WORLD backwards.  Cranial Nerves: Pupils are equally round and reactive to light. Visual fields full to confrontation. Extraocular movements are intact without nystagmus. Facial sensation and muscles of mastication are intact. Muscles of facial expression are symmetric. Hearing intact to bilateral finger rub. Tongue protrusion, uvula, palate midline.  Shoulder shrug intact  Motor:  The patient has normal bulk and tone, no pronator drift.  There are no adventitious movements.  5/5 strength  Reflexes:   Biceps  Triceps Brachioradialis Knee Ankle  Right 2+  2+  2+   2+ 2+  Left  2+  2+  2+   2+ 2+  Toes down  Coordination:  Normal  finger to nose.  No dysdiadokinesia.  Sensation is intact to temperature and vibration.  Gait and Station are mildly unstable    Romberg is negative   Impression: Spell of uncertain origin.  Obviously TIA is the main worry here, and it certainly could have been.  Other possibilities are seizure and confusional arousal from sleep, or perhaps a diabetic "low".  I also think he has at least minor cognitive impairment perhaps dementia. One of my main concerns is his driving, given his dementia.  I have asked he to consider a drivers rehabilitation evaluation.  At his next visit I will likely have him get memory testing.  Recommendations: MRI stroke protocol.  Drivers rehab evaluation as above.   We will see the patient back in 6 weeks.  Thank you for having [place] see Juan Patel in consultation.  Feel  free to contact me with any questions.  Lupita Raider Modesto Charon, MD [facility], [place] [address] [place], [place] [address] Phone: [phone] Fax: [phone].

## 2011-07-29 ENCOUNTER — Other Ambulatory Visit (HOSPITAL_COMMUNITY): Payer: Medicare Other

## 2011-07-29 ENCOUNTER — Ambulatory Visit (HOSPITAL_COMMUNITY)
Admission: RE | Admit: 2011-07-29 | Discharge: 2011-07-29 | Disposition: A | Discharge status: Home / Self Care | Payer: Medicare Other | Source: Ambulatory Visit | Attending: Neurology | Admitting: Neurology

## 2011-07-29 DIAGNOSIS — I651 Occlusion and stenosis of basilar artery: Secondary | ICD-10-CM | POA: Insufficient documentation

## 2011-07-29 DIAGNOSIS — R930 Abnormal findings on diagnostic imaging of skull and head, not elsewhere classified: Secondary | ICD-10-CM

## 2011-07-29 DIAGNOSIS — F29 Unspecified psychosis not due to a substance or known physiological condition: Secondary | ICD-10-CM | POA: Insufficient documentation

## 2011-07-29 DIAGNOSIS — R413 Other amnesia: Secondary | ICD-10-CM | POA: Insufficient documentation

## 2011-07-29 DIAGNOSIS — R410 Disorientation, unspecified: Secondary | ICD-10-CM

## 2011-07-29 DIAGNOSIS — G319 Degenerative disease of nervous system, unspecified: Secondary | ICD-10-CM | POA: Insufficient documentation

## 2011-07-29 MED ORDER — GADOBENATE DIMEGLUMINE 529 MG/ML IV SOLN
17.0000 mL | Freq: Once | INTRAVENOUS | Status: AC | PRN
  Administered 2011-07-29: 17 mL via INTRAVENOUS

## 2011-07-30 ENCOUNTER — Encounter: Payer: Self-pay | Admitting: Cardiology

## 2011-07-30 ENCOUNTER — Ambulatory Visit (INDEPENDENT_AMBULATORY_CARE_PROVIDER_SITE_OTHER): Payer: Medicare Other | Admitting: Cardiology

## 2011-07-30 DIAGNOSIS — I714 Abdominal aortic aneurysm, without rupture: Secondary | ICD-10-CM

## 2011-07-30 DIAGNOSIS — E785 Hyperlipidemia, unspecified: Secondary | ICD-10-CM

## 2011-07-30 DIAGNOSIS — F29 Unspecified psychosis not due to a substance or known physiological condition: Secondary | ICD-10-CM

## 2011-07-30 DIAGNOSIS — I6529 Occlusion and stenosis of unspecified carotid artery: Secondary | ICD-10-CM | POA: Insufficient documentation

## 2011-07-30 DIAGNOSIS — I2510 Atherosclerotic heart disease of native coronary artery without angina pectoris: Secondary | ICD-10-CM

## 2011-07-30 DIAGNOSIS — R410 Disorientation, unspecified: Secondary | ICD-10-CM

## 2011-07-30 DIAGNOSIS — R011 Cardiac murmur, unspecified: Secondary | ICD-10-CM

## 2011-07-30 DIAGNOSIS — I10 Essential (primary) hypertension: Secondary | ICD-10-CM

## 2011-07-30 NOTE — Assessment & Plan Note (Signed)
The blood pressure is at target. No change in medications is indicated. We will continue with therapeutic lifestyle changes (TLC).  

## 2011-07-30 NOTE — Assessment & Plan Note (Signed)
His ultrasound demonstrated a slight enlargement and we will follow this again in six months.

## 2011-07-30 NOTE — Assessment & Plan Note (Signed)
The patient has no new sypmtoms.  No further cardiovascular testing is indicated.  We will continue with aggressive risk reduction and meds as listed.  He had a low risk stress test in 9/10.   

## 2011-07-30 NOTE — Assessment & Plan Note (Signed)
I will defer to Josue Hector, MD

## 2011-07-30 NOTE — Patient Instructions (Addendum)
The current medical regimen is effective;  continue present plan and medications.  Follow up in 6 months with Dr Hochrein.  You will receive a letter in the mail 2 months before you are due.  Please call us when you receive this letter to schedule your follow up appointment.  

## 2011-07-30 NOTE — Assessment & Plan Note (Signed)
He had 0 - 39% stenosis and I will follow this again in 2 years.

## 2011-07-30 NOTE — Progress Notes (Signed)
HPI The patient presents for followup of vascular disease and an episode of confusion.  This is described in the previous note.  Since I saw him he has seen [facility] Dr. Modesto Charon.  He has had a workup including an MRI yesterday. I preliminarily reviewed this with him. There was small vessel disease. I reviewed his carotid which demonstrated 0-39% stenosis. His echocardiogram demonstrated only mild aortic sclerosis but a preserved ejection fraction. Finally he had some small increase in the size of an abdominal aortic aneurysm and I reviewed this result it is well.  He quit working out at [employer] because of some leg weakness.  The patient denies any new symptoms such as chest discomfort, neck or arm discomfort. There has been no new shortness of breath, PND or orthopnea. There have been no reported palpitations, presyncope or syncope. There have been no complaints of decreased vision or motor activity.  No Known Allergies  Current Outpatient Prescriptions  Medication Sig Dispense Refill  . aspirin 81 MG tablet Take 81 mg by mouth 2 (two) times daily.        Marland Kitchen atorvastatin (LIPITOR) 10 MG tablet Take 10 mg by mouth daily.        Marland Kitchen escitalopram (LEXAPRO) 20 MG tablet Take 20 mg by mouth daily.        . metoprolol (LOPRESSOR) 50 MG tablet Take 50 mg by mouth 2 (two) times daily.        . Multiple Vitamin (MULTIVITAMIN) tablet Take 1 tablet by mouth daily.         No current facility-administered medications for this visit.   Facility-Administered Medications Ordered in Other Visits  Medication Dose Route Frequency Provider Last Rate Last Dose  . gadobenate dimeglumine (MULTIHANCE) injection 17 mL  17 mL Intravenous Once PRN Medication Radiologist   17 mL at 07/29/11 1758    Past Medical History  Diagnosis Date  . CAD (coronary artery disease)     S/P CABG   . Dyslipidemia   . Abdominal aortic aneurysm     4.1 x 4.2  . Diabetes mellitus   . HTN (hypertension) 07/04/2011    Past  Surgical History  Procedure Date  . Coronary artery bypass graft 1993    RIMA to the RCA, LIMA to the LAD, SVG to diagonal, and SVG to circumflex    ROS:  As stated in the HPI and negative for all other systems.  PHYSICAL EXAM BP 142/80  Pulse 70  Resp 18  Ht 6\' 1"  (1.854 m)  Wt 187 lb (84.823 kg)  BMI 24.67 kg/m2 GENERAL:  Well appearing HEENT:  Pupils equal round and reactive, fundi not visualized, oral mucosa unremarkable NECK:  No jugular venous distention, waveform within normal limits, carotid upstroke brisk and symmetric, no bruits, no thyromegaly LYMPHATICS:  No cervical, inguinal adenopathy LUNGS:  Clear to auscultation bilaterally BACK:  No CVA tenderness CHEST:  Well healed sternotomy scar. HEART:  PMI not displaced or sustained,S1 and S2 within normal limits, no S3, no S4, no clicks, no rubs, apical systolic murmur early peaking ABD:  Flat, positive bowel sounds normal in frequency in pitch, no bruits, no rebound, no guarding, no midline pulsatile mass, no hepatomegaly, no splenomegaly EXT:  2 plus pulses throughout, no edema, no cyanosis no clubbing SKIN:  No rashes no nodules NEURO:  Cranial nerves II through XII grossly intact, motor grossly intact throughout PSYCH:  Cognitively intact, oriented to person place and time, disheveled   ASSESSMENT AND PLAN

## 2011-07-30 NOTE — Assessment & Plan Note (Signed)
He will follow with Dr. Modesto Charon.

## 2011-07-30 NOTE — Assessment & Plan Note (Signed)
This is aortic sclerosis and TR.  No further work up is indicated.

## 2011-08-02 ENCOUNTER — Encounter: Payer: Self-pay | Admitting: Neurology

## 2011-08-14 ENCOUNTER — Telehealth: Payer: Self-pay | Admitting: Neurology

## 2011-08-14 NOTE — Telephone Encounter (Signed)
Pt's wife stated that they would like to attend your talk, as you suggested, but they do not drive at night. She has called the hospital and requested that they mail her any materials having to do with the event. She wanted to keep you updated and let you know that they are very willing to follow your advice.

## 2011-09-04 ENCOUNTER — Encounter: Payer: Self-pay | Admitting: Neurology

## 2011-09-04 ENCOUNTER — Ambulatory Visit (INDEPENDENT_AMBULATORY_CARE_PROVIDER_SITE_OTHER): Payer: Medicare Other | Admitting: Neurology

## 2011-09-04 VITALS — BP 138/76 | HR 58 | Wt 186.0 lb

## 2011-09-04 DIAGNOSIS — F0390 Unspecified dementia without behavioral disturbance: Secondary | ICD-10-CM

## 2011-09-04 MED ORDER — DONEPEZIL HCL 5 MG PO TABS: ORAL_TABLET | ORAL | Status: DC

## 2011-09-04 MED ORDER — DONEPEZIL HCL 10 MG PO TABS: ORAL_TABLET | ORAL | Status: DC

## 2011-09-04 NOTE — Progress Notes (Signed)
Dear Dr. Lysbeth Galas,  I saw  Juan Patel(Juan Patel) back in [facility] clinic for his problem with transient spell of confusion and probabl dementia.  As you may recall, he is a 77 y.o. year old male with a history of HTN, AAA, HLD who had a spell of prolonged confusion, with dysarthria/dysphasia lasting about 1 hour.  I felt that it may have been a TIA, confusional arousal from sleep, a diabetic "low" or less likely a seizure.  His MRI brain revealed very significant atrophy and white matter disease, but his MRA head and neck were unremarkable.  One of my major concerns is his memory and other spells of confusion.  In particular, it sounds like he is frequently getting confused while driving.  I spoke to him last time about getting a drivers evaluation and he was resistant.  Since I last saw him he has had other spells of confusion, that appear to be related to his dementia/cognitive impairment, but no spell where he has been dysphasic.  Medical history, social history, and family history were reviewed and have not changed since the last clinic visit.  Current Outpatient Prescriptions on File Prior to Visit  Medication Sig Dispense Refill  . aspirin 81 MG tablet Take 81 mg by mouth 2 (two) times daily.        Marland Kitchen atorvastatin (LIPITOR) 10 MG tablet Take 10 mg by mouth daily.        Marland Kitchen escitalopram (LEXAPRO) 20 MG tablet Take 20 mg by mouth daily.        . metoprolol (LOPRESSOR) 50 MG tablet Take 50 mg by mouth 2 (two) times daily.        . Multiple Vitamin (MULTIVITAMIN) tablet Take 1 tablet by mouth daily.          No Known Allergies  ROS:  13 systems were reviewed and are notable for difficulty walking, and getting out of low seats, he feels this is worse.  No hallucinations at this time.  All other review of systems are unremarkable.   Impression/Recs:  I am not sure what the spell of confusion/aphasia was, and frankly I don't think it was an obvious TIA.   A seizure is possible given the  degree of changes in his brain.  Getting more information about his short confusional spells related to his memory makes me think that he is likely more memory impaired than I first thought.  I am very worried about his driving given him probably dementia, and he has agreed to take  a drivers evaluation test.  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  If he has another spell of confusion with dysarthria/dysphasia we will get an EEG.  At the end of the appointment he said that he is having difficulties getting out of a car or low seats.  I will see him back in six weeks to evaluate this more carefully.   Lupita Raider Modesto Charon, MD [facility], [place]

## 2011-09-04 NOTE — Patient Instructions (Signed)
Your prescription has been sent to your pharmacy.  The number for the Driver Rehab Evaluations is [phone].  [employer].  Please call to set up evaluation.  Your follow up appointment with Dr. Modesto Charon is January 11th at 10:00am.

## 2011-10-17 ENCOUNTER — Ambulatory Visit (INDEPENDENT_AMBULATORY_CARE_PROVIDER_SITE_OTHER): Payer: Medicare Other | Admitting: Neurology

## 2011-10-17 ENCOUNTER — Encounter: Payer: Self-pay | Admitting: Neurology

## 2011-10-17 DIAGNOSIS — R410 Disorientation, unspecified: Secondary | ICD-10-CM

## 2011-10-17 DIAGNOSIS — F0390 Unspecified dementia without behavioral disturbance: Secondary | ICD-10-CM | POA: Insufficient documentation

## 2011-10-17 DIAGNOSIS — F29 Unspecified psychosis not due to a substance or known physiological condition: Secondary | ICD-10-CM

## 2011-10-17 NOTE — Progress Notes (Signed)
Dear Dr. Lysbeth Galas,  I saw  Juan Patel back in [facility] for his problem with weakness in his legs.  As you may recall, he is a 78 y.o. year old male with a history of a spell of confusion and dementia who began complaining of difficulty getting out of the car after he had the spell of interest.  Incidentally, he has not had been going to the gym since he had the spell and has felt he has gotten weaker since then.  At my last visit he was placed on Aricept 5->10 and has just increased to 10mg  a day.  He does feel has been having worsening "bad dreams" since being on the higher dose but did not notice on the lower dose.  He denies significant back or neck pain.  He can't endorse a worse leg.   Medical history, social history, and family history were reviewed and have not changed since the last clinic visit.  Current Outpatient Prescriptions on File Prior to Visit  Medication Sig Dispense Refill  . aspirin 81 MG tablet Take 81 mg by mouth 2 (two) times daily.        Marland Kitchen atorvastatin (LIPITOR) 10 MG tablet Take 10 mg by mouth daily.        Marland Kitchen donepezil (ARICEPT) 10 MG tablet take 1 tablet at day, start after taking 5mg  tabs daily for 30 days.  30 tablet  3  . donepezil (ARICEPT) 5 MG tablet take 5 mg a day for first 30 days, and then switch to 10mg  tablet.  30 tablet  0  . escitalopram (LEXAPRO) 20 MG tablet Take 20 mg by mouth daily.        . metoprolol (LOPRESSOR) 50 MG tablet Take 50 mg by mouth 2 (two) times daily.        . Multiple Vitamin (MULTIVITAMIN) tablet Take 1 tablet by mouth daily.          No Known Allergies  ROS:  13 systems were reviewed and are notable for memory loss.  All other review of systems are unremarkable.  Exam: . Filed Vitals:   10/17/11 0945  BP: 126/78  Pulse: 84  Weight: 186 lb (84.369 kg)    In general, thin appearing older man.    Back: clear scoliosis with one hip higher than the other.  Cranial Nerves: Shoulder shrug  intact.  Motor:  Normal bulk and tone, no drift and 5/5 muscle strength bilaterally except for weakness of the bilateral hip flexors at 4+.  Reflexes:  2+ thoughout, toes down.  Coordination:  Normal finger to nose  Gait:  Normal gait and station.  Romberg negative.  Clearly drags his left leg due to his left hip being lower than his right.  Impression/Recs 1.  Memory disorder - continue Aricept, I suspect he will begin to tolerate the higher dose, and the "bad dreams" will attenuate.  If not we can try Exelon. 2.  Leg weakness.  I am not finding anything concrete here.  I suspect this is due to deconditioning from not going to the gym since his spell of confusion.  I have encouraged him to go back to the gym.  I may consider gait PT in the future.  We will see the patient back in 3 months.  Lupita Raider Modesto Charon, MD [facility], [place]

## 2011-10-17 NOTE — Patient Instructions (Signed)
Go back to the gym and we will see you back April 11th at 10:00am.

## 2011-11-03 ENCOUNTER — Other Ambulatory Visit: Payer: Self-pay | Admitting: Neurology

## 2011-11-03 ENCOUNTER — Telehealth: Payer: Self-pay | Admitting: Neurology

## 2011-11-03 MED ORDER — DONEPEZIL HCL 10 MG PO TABS: ORAL_TABLET | ORAL | Status: DC

## 2011-11-03 NOTE — Telephone Encounter (Signed)
[employer] and spoke with the pharmacist, Gery Pray. Verbally ordered Aricept 10 mg one q day #90 with 3 refills. Patient aware.

## 2011-12-02 ENCOUNTER — Telehealth: Payer: Self-pay | Admitting: Cardiology

## 2011-12-31 ENCOUNTER — Encounter: Payer: Self-pay | Admitting: Cardiology

## 2011-12-31 ENCOUNTER — Ambulatory Visit (INDEPENDENT_AMBULATORY_CARE_PROVIDER_SITE_OTHER): Payer: Medicare Other | Admitting: Cardiology

## 2011-12-31 VITALS — BP 140/90 | HR 55 | Ht 74.0 in | Wt 188.0 lb

## 2011-12-31 DIAGNOSIS — I2510 Atherosclerotic heart disease of native coronary artery without angina pectoris: Secondary | ICD-10-CM

## 2011-12-31 DIAGNOSIS — I6529 Occlusion and stenosis of unspecified carotid artery: Secondary | ICD-10-CM

## 2011-12-31 DIAGNOSIS — F0390 Unspecified dementia without behavioral disturbance: Secondary | ICD-10-CM

## 2011-12-31 DIAGNOSIS — I10 Essential (primary) hypertension: Secondary | ICD-10-CM

## 2011-12-31 DIAGNOSIS — I714 Abdominal aortic aneurysm, without rupture: Secondary | ICD-10-CM

## 2011-12-31 NOTE — Assessment & Plan Note (Signed)
The blood pressure is at target. No change in medications is indicated. We will continue with therapeutic lifestyle changes (TLC).  

## 2011-12-31 NOTE — Assessment & Plan Note (Signed)
The patient has no new sypmtoms.  No further cardiovascular testing is indicated.  We will continue with aggressive risk reduction and meds as listed.  

## 2011-12-31 NOTE — Patient Instructions (Signed)
Follow up in 1 year with Dr Hochrein.  You will receive a letter in the mail 2 months before you are due.  Please call us when you receive this letter to schedule your follow up appointment.  The current medical regimen is effective;  continue present plan and medications.  

## 2011-12-31 NOTE — Assessment & Plan Note (Signed)
This is 4.7 x 4.6 and will be scanned again in April.

## 2011-12-31 NOTE — Assessment & Plan Note (Signed)
This was mild and will be looked at again in 2014

## 2011-12-31 NOTE — Assessment & Plan Note (Signed)
He seems to be better.  No change in therapy is indicated.

## 2011-12-31 NOTE — Progress Notes (Signed)
   HPI He returns for follow up.  He has been following with neurology for some dementia and is being managed now Aricept. He seems less confused today. He started walking he is not back at the [facility].  The patient denies any new symptoms such as chest discomfort, neck or arm discomfort. There has been no new shortness of breath, PND or orthopnea. There have been no reported palpitations, presyncope or syncope.  No Known Allergies  Current Outpatient Prescriptions  Medication Sig Dispense Refill  . aspirin 81 MG tablet Take 81 mg by mouth 2 (two) times daily.        Marland Kitchen atorvastatin (LIPITOR) 10 MG tablet Take 10 mg by mouth daily.        Marland Kitchen donepezil (ARICEPT) 10 MG tablet 1 tablet every day.  90 tablet  3  . escitalopram (LEXAPRO) 20 MG tablet Take 20 mg by mouth daily.        . metoprolol (LOPRESSOR) 50 MG tablet Take 50 mg by mouth 2 (two) times daily.        . Multiple Vitamin (MULTIVITAMIN) tablet Take 1 tablet by mouth daily.          Past Medical History  Diagnosis Date  . CAD (coronary artery disease)     S/P CABG   . Dyslipidemia   . Abdominal aortic aneurysm     4.1 x 4.2  . Diabetes mellitus   . HTN (hypertension) 07/04/2011    Past Surgical History  Procedure Date  . Coronary artery bypass graft 1993    RIMA to the RCA, LIMA to the LAD, SVG to diagonal, and SVG to circumflex    ROS:  As stated in the HPI and negative for all other systems.  PHYSICAL EXAM BP 140/90  Pulse 55  Ht 6\' 2"  (1.88 m)  Wt 188 lb (85.276 kg)  BMI 24.14 kg/m2 GENERAL:  Well appearing HEENT:  Pupils equal round and reactive, fundi not visualized, oral mucosa unremarkable NECK:  No jugular venous distention, waveform within normal limits, carotid upstroke brisk and symmetric, no bruits, no thyromegaly LYMPHATICS:  No cervical, inguinal adenopathy LUNGS:  Clear to auscultation bilaterally BACK:  No CVA tenderness CHEST:  Well healed sternotomy scar. HEART:  PMI not displaced or sustained,S1  and S2 within normal limits, no S3, no S4, no clicks, no rubs, soft apical systolic murmur early peaking ABD:  Flat, positive bowel sounds normal in frequency in pitch, no bruits, no rebound, no guarding, no midline pulsatile mass, no hepatomegaly, no splenomegaly EXT:  2 plus pulses throughout, no edema, no cyanosis no clubbing SKIN:  No rashes no nodules NEURO:  Cranial nerves II through XII grossly intact, motor grossly intact throughout PSYCH:  Cognitively intact, oriented to person place and time, less confused than previous.  EKG:  Sinus rhythm, rate 55, axis within normal limits, intervals within normal limits, no acute ST-T wave changes. 12/31/2011  ASSESSMENT AND PLAN

## 2012-01-14 ENCOUNTER — Other Ambulatory Visit: Payer: Self-pay | Admitting: Cardiology

## 2012-01-14 DIAGNOSIS — I714 Abdominal aortic aneurysm, without rupture: Secondary | ICD-10-CM

## 2012-01-15 ENCOUNTER — Encounter: Payer: Self-pay | Admitting: Neurology

## 2012-01-15 ENCOUNTER — Ambulatory Visit (INDEPENDENT_AMBULATORY_CARE_PROVIDER_SITE_OTHER): Payer: Medicare Other | Admitting: Neurology

## 2012-01-15 VITALS — BP 130/76 | HR 56 | Wt 186.0 lb

## 2012-01-15 DIAGNOSIS — F0390 Unspecified dementia without behavioral disturbance: Secondary | ICD-10-CM

## 2012-01-15 NOTE — Progress Notes (Signed)
   Dear Dr. Lysbeth Galas,  I saw  [facility] back in [facility] for his problem with possible Alzheimer's dementia or mixed dementia.  As you may recall, he is a 78 y.o. year old male with a history of memory loss and infrequent falls and a transient spell of confusion who I placed on Aricept recently.  He has tolerated it well - he did have some problems with vivid dreams but these have diminished.  He has not had any further spells of confusion.  MRI revealed very significant atrophy and white matter disease, but MRA was unremarkable.  His wife says that since he started the Aricept he has been less confused.  She feels he is safe to drive short distances.  He has not had any falls, and is exercising more regularly by walking.  I evaluated his gait and felt his self reported "leg weakness" was likely due to deconditioning.    Medical history, social history, and family history were reviewed and have not changed since the last clinic visit.  Current Outpatient Prescriptions on File Prior to Visit  Medication Sig Dispense Refill  . aspirin 81 MG tablet Take 81 mg by mouth 2 (two) times daily.        Marland Kitchen atorvastatin (LIPITOR) 10 MG tablet Take 10 mg by mouth daily.        Marland Kitchen donepezil (ARICEPT) 10 MG tablet 1 tablet every day.  90 tablet  3  . escitalopram (LEXAPRO) 20 MG tablet Take 20 mg by mouth daily.        . metoprolol (LOPRESSOR) 50 MG tablet Take 50 mg by mouth 2 (two) times daily.        . Multiple Vitamin (MULTIVITAMIN) tablet Take 1 tablet by mouth daily.          No Known Allergies  ROS:  13 systems were reviewed and  are unremarkable.  Exam: . Filed Vitals:   01/15/12 0946  BP: 130/76  Pulse: 56  Weight: 186 lb (84.369 kg)    In general, well appearing man.  MMSE 29/30 - wrong day of month.    Impression/Recommendations:  1.  Possible Alzheimer's/mixed dementia - improved with Aricept.  I am of course anxious about him driving, but his wife seems to be  a good judge of his capabilities and past entreaties to have him get drivers rehab have been fruitless, so we will continue to watch him closely. 2.  Leg weakness - likely deconditioning.  Encouraged to get back to the gym and continue walking.  We will see the patient back in 4 months.  Juan Raider Modesto Charon, MD [facility], [place]

## 2012-01-21 ENCOUNTER — Encounter (INDEPENDENT_AMBULATORY_CARE_PROVIDER_SITE_OTHER): Payer: Medicare Other

## 2012-01-21 DIAGNOSIS — I714 Abdominal aortic aneurysm, without rupture: Secondary | ICD-10-CM

## 2012-03-30 ENCOUNTER — Other Ambulatory Visit: Payer: Self-pay | Admitting: Dermatology

## 2012-05-20 ENCOUNTER — Encounter: Payer: Self-pay | Admitting: Neurology

## 2012-05-20 ENCOUNTER — Ambulatory Visit (INDEPENDENT_AMBULATORY_CARE_PROVIDER_SITE_OTHER): Payer: Medicare Other | Admitting: Neurology

## 2012-05-20 VITALS — BP 124/76 | HR 60 | Wt 188.0 lb

## 2012-05-20 DIAGNOSIS — F0390 Unspecified dementia without behavioral disturbance: Secondary | ICD-10-CM

## 2012-05-20 NOTE — Progress Notes (Signed)
Dear Dr. Lysbeth Galas,  I saw  Juan Patel back in [facility] for his problem with memory.  As you may recall, he is a 78 y.o. year old male with a history of CAD, hypertension who has had progressive memory decline.   He originally presented with a transient spell of confusion that I was unsure of its etiology, although I thought likely from his underlying dementia.  MRI revealed quite significant atrophy and white matter disease, with the MRA of the head and neck being unremarkable.  These findings pointed towards a vascular dementia/mixed dementia.  The patient has responded well to donepezil.  His wife says that he is doing about the same in terms of his forgetfulness.  They went on a trip to [place] and he only had one time where he had a problem with confusion on the airplane returning home.  It did not clearly have a spell of altered consciousness with it.  He has not had any falls, but is not exercising as much as he did in the past.  I felt in the past that his problems with gait and leg weakness were likely due to deconditioning.  Medical history, social history, and family history were reviewed and have not changed since the last clinic visit.  Current Outpatient Prescriptions on File Prior to Visit  Medication Sig Dispense Refill  . aspirin 81 MG tablet Take 81 mg by mouth 2 (two) times daily.        Marland Kitchen atorvastatin (LIPITOR) 10 MG tablet Take 10 mg by mouth daily.        Marland Kitchen donepezil (ARICEPT) 10 MG tablet 1 tablet every day.  90 tablet  3  . escitalopram (LEXAPRO) 20 MG tablet Take 20 mg by mouth daily.        . metoprolol (LOPRESSOR) 50 MG tablet Take 50 mg by mouth 2 (two) times daily.        . Multiple Vitamin (MULTIVITAMIN) tablet Take 1 tablet by mouth daily.          No Known Allergies  ROS:  13 systems were reviewed and are notable for mild gait instability and problems with depression.  All other review of systems are unremarkable.  Exam: . Filed Vitals:   05/20/12 1101  BP: 124/76  Pulse: 60  Weight: 188 lb (85.276 kg)    In general, well appearing man.  Test of time 4/5; place 5/5; WORLD 3/5(transposed two letters).  3/3 recall.  Gait:  Mildly unstable, but no change from previous.  Impression/Recommendations:  1.  Memory disorder - likely a mild vascular dementia or mixed type.  He should continue on the donepezil.  I encouraged him to continue to be active. 2.  Spells of confusion - I am not concerned for TIA or seizure at this time.  I think these are due to the transient confusion you can see in memory disorders.  As I will be leaving for academic practice at [facility] he can return to see you in follow up   Lupita Raider. Modesto Charon, MD [facility], [place]

## 2012-05-20 NOTE — Patient Instructions (Signed)
  Juan Patel has memory problems that are very mild.  He has improved on the Aricept.  The cause of his memory problems are unknown, they may be related to atherosclerosis that has affected his heart as well as his arteries in the past.  While I cannot predict if he will get worse, I think his continued stability is a good sign that he will be stable in the future.

## 2012-05-24 MED ORDER — DONEPEZIL HCL 10 MG PO TABS: 10.0000 mg | ORAL_TABLET | Freq: Every day | ORAL | Status: DC

## 2012-07-20 ENCOUNTER — Other Ambulatory Visit: Payer: Self-pay | Admitting: Cardiology

## 2012-07-20 DIAGNOSIS — I714 Abdominal aortic aneurysm, without rupture: Secondary | ICD-10-CM

## 2012-07-26 ENCOUNTER — Encounter (INDEPENDENT_AMBULATORY_CARE_PROVIDER_SITE_OTHER): Payer: Medicare Other

## 2012-07-26 DIAGNOSIS — I714 Abdominal aortic aneurysm, without rupture: Secondary | ICD-10-CM

## 2012-07-30 ENCOUNTER — Other Ambulatory Visit: Payer: Self-pay | Admitting: *Deleted

## 2012-07-30 DIAGNOSIS — I714 Abdominal aortic aneurysm, without rupture, unspecified: Secondary | ICD-10-CM

## 2012-08-16 ENCOUNTER — Encounter: Payer: Self-pay | Admitting: Vascular Surgery

## 2012-08-17 ENCOUNTER — Other Ambulatory Visit: Payer: Self-pay | Admitting: Vascular Surgery

## 2012-08-17 ENCOUNTER — Encounter: Payer: Self-pay | Admitting: Vascular Surgery

## 2012-08-17 ENCOUNTER — Ambulatory Visit (INDEPENDENT_AMBULATORY_CARE_PROVIDER_SITE_OTHER): Payer: Medicare Other | Admitting: Vascular Surgery

## 2012-08-17 VITALS — BP 162/78 | HR 52 | Resp 20 | Ht 74.0 in | Wt 190.0 lb

## 2012-08-17 DIAGNOSIS — I714 Abdominal aortic aneurysm, without rupture: Secondary | ICD-10-CM

## 2012-08-17 LAB — CREATININE, SERUM: Creat: 0.94 mg/dL (ref 0.50–1.35)

## 2012-08-17 NOTE — Progress Notes (Signed)
[facility] [place]   Patient name: Juan Patel MRN: [id number] DOB: 01/11/1926 Sex: male   Referred by: Antoine Poche  Reason for referral:  Chief Complaint  Patient presents with  . AAA    NEW EVALUATION  REFERRED BY DR Fayrene Fearing HOCHREIN    HISTORY OF PRESENT ILLNESS: The patient is an 78 year old gentleman with history of abdominal aortic aneurysm. This was discovered on a lifeline screening 6 or 7 years ago and has been followed over the course of this time was ultrasound. He has no symptoms specifically related to the aneurysm. He has no upper abdominal or back discomfort. A recent ultrasound revealed maximal diameter of 5.1 cm which is enlarged 0.5 cm in 6 months. He is here today for further discussion of this. He has no history of other aneurysms in the family history of aneurysm. He does have a significant history of coronary artery disease in the past. He underwent angioplasty in 1993 centrally developed recurrent chest pain and underwent a coronary bypass grafting in the mid 90s. He reports that he is had no new cardiac difficulties since that time.  Past Medical History  Diagnosis Date  . CAD (coronary artery disease)     S/P CABG   . Dyslipidemia   . Abdominal aortic aneurysm     4.1 x 4.2  . Diabetes mellitus   . HTN (hypertension) 07/04/2011    Past Surgical History  Procedure Date  . Coronary artery bypass graft 1993    RIMA to the RCA, LIMA to the LAD, SVG to diagonal, and SVG to circumflex  . Appendectomy     History   Social History  . Marital Status: Married    Spouse Name: N/A    Number of Children: N/A  . Years of Education: N/A   Occupational History  . Not on file.   Social History Main Topics  . Smoking status: Former Smoker -- 0.5 packs/day    Types: Cigarettes    Quit date: 10/26/1961  . Smokeless tobacco: Never Used  . Alcohol Use: Yes     Comment: rarely  . Drug Use: No  . Sexually Active: Not on file   Other Topics  Concern  . Not on file   Social History Narrative  . No narrative on file    Family History  Problem Relation Age of Onset  . Heart disease Father     Allergies as of 08/17/2012  . (No Known Allergies)    Current Outpatient Prescriptions on File Prior to Visit  Medication Sig Dispense Refill  . aspirin 81 MG tablet Take 81 mg by mouth 2 (two) times daily.        Marland Kitchen atorvastatin (LIPITOR) 10 MG tablet Take 10 mg by mouth daily.        Marland Kitchen donepezil (ARICEPT) 10 MG tablet Take 1 tablet (10 mg total) by mouth at bedtime.  90 tablet  3  . escitalopram (LEXAPRO) 20 MG tablet Take 20 mg by mouth daily.        . metoprolol (LOPRESSOR) 50 MG tablet Take 50 mg by mouth 2 (two) times daily.        . Multiple Vitamin (MULTIVITAMIN) tablet Take 1 tablet by mouth daily.           REVIEW OF SYSTEMS:  Positives indicated with an "X"  CARDIOVASCULAR:  [ ]  chest pain   [ ]  chest pressure   [ ]  palpitations   [ ]  orthopnea   [ ]   dyspnea on exertion   [ ]  claudication   [ ]  rest pain   [ ]  DVT   [ ]  phlebitis PULMONARY:   [ ]  productive cough   [ ]  asthma   [ ]  wheezing NEUROLOGIC:   [ ]  weakness  [ ]  paresthesias  [ ]  aphasia  [ ]  amaurosis  [ ]  dizziness HEMATOLOGIC:   [ ]  bleeding problems   [ ]  clotting disorders MUSCULOSKELETAL:  [ ]  joint pain   [ ]  joint swelling GASTROINTESTINAL: [ ]   blood in stool  [ ]   hematemesis GENITOURINARY:  [ ]   dysuria  [ ]   hematuria PSYCHIATRIC:  [ ]  history of major depression INTEGUMENTARY:  [ ]  rashes  [ ]  ulcers CONSTITUTIONAL:  [ ]  fever   [ ]  chills  PHYSICAL EXAMINATION:  General: The patient is a well-nourished male, in no acute distress. Vital signs are BP 162/78  Pulse 52  Resp 20  Ht 6\' 2"  (1.88 m)  Wt 190 lb (86.183 kg)  BMI 24.39 kg/m2 Pulmonary: There is a good air exchange bilaterally without wheezing or rales. Abdomen: Soft and non-tender with normal pitch bowel sounds. He does have a prominent aortic pulsation Musculoskeletal:  There are no major deformities.  There is no significant extremity pain. Neurologic: No focal weakness or paresthesias are detected, Skin: There are no ulcer or rashes noted. Psychiatric: The patient has normal affect. Cardiovascular: There is a regular rate and rhythm without significant murmur appreciated. Pulse status 2+ radial 2+ femoral 2+ popliteal and 2+ dorsalis pedis pulses bilaterally with no evidence of peripheral artery aneurysm   Vascular Lab Studies:  Ultrasound outpatient lab was reviewed. This does show maximal 5.1 cm  Impression and Plan:  5.1 cm infrarenal abdominal aortic aneurysm with 1/2 cm growth in 6 months. I discussed this at length with the patient and his wife present. I have recommended CT angiogram for further evaluation. I explained the option of open and stent graft repair. We will right make recommendations regarding continued ultrasound observation or repair depending on his anatomy. We have scheduled a CT angiogram and will discuss this with him for further evaluation  Adiana Smelcer [facility] [place] Office: [phone]

## 2012-08-17 NOTE — Addendum Note (Signed)
Addended by: Melodye Ped C on: 08/17/2012 10:48 AM   Modules accepted: Orders

## 2012-08-30 ENCOUNTER — Encounter: Payer: Self-pay | Admitting: Vascular Surgery

## 2012-08-31 ENCOUNTER — Encounter: Payer: Self-pay | Admitting: Vascular Surgery

## 2012-08-31 ENCOUNTER — Ambulatory Visit
Admission: RE | Admit: 2012-08-31 | Discharge: 2012-08-31 | Disposition: A | Discharge status: Home / Self Care | Payer: Medicare Other | Source: Ambulatory Visit | Attending: Vascular Surgery | Admitting: Vascular Surgery

## 2012-08-31 ENCOUNTER — Ambulatory Visit (INDEPENDENT_AMBULATORY_CARE_PROVIDER_SITE_OTHER): Payer: Medicare Other | Admitting: Vascular Surgery

## 2012-08-31 VITALS — BP 149/84 | HR 97 | Resp 20 | Ht 74.0 in | Wt 183.0 lb

## 2012-08-31 DIAGNOSIS — I714 Abdominal aortic aneurysm, without rupture: Secondary | ICD-10-CM

## 2012-08-31 MED ORDER — IOHEXOL 350 MG/ML SOLN
100.0000 mL | Freq: Once | INTRAVENOUS | Status: AC | PRN
  Administered 2012-08-31: 100 mL via INTRAVENOUS

## 2012-08-31 NOTE — Progress Notes (Signed)
The patient presents today for continued discussion of his infrarenal abdominal aortic aneurysm. He is here today with his wife. I had seen him in initial consultation on 08/17/2012. At that time he had undergone ultrasound which showed a 5.1 cm aneurysm in his abdominal aorta. Of concern was potential has centimeter growth in 6 months by ultrasound. I recommended further evaluation with CT scan. He is here today having undergone a CT scan today of his abdomen and pelvis. I have reviewed his films and discuss this with the patient and his wife today.  Physical exam and medical history are unchanged from 2 weeks ago. His abdomen is soft and nontender he does have a palpable aneurysm which is nontender  CT scan confirmed maximal diameter of 5.1 cm. He does have acceptable anatomy for stent graft repair. He does have some aneurysmal change in his iliac arteries as well with a maximum of 3.4 cm on the right.  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 I explained that this would certainly be at the threshold for recommendation of elective repair. I did explain the concern regarding a apparent more rapid than expected grams by ultrasound. I have offered repair today versus close observation. I am comfortable with seeing him again in 3 months with ultrasound to determine if he has increased enlargement of his aortic or iliac artery aneurysm. If this is the case I would recommend repair. Otherwise we can continue to follow this at 6 month intervals following this with ultrasound. I again discussed symptoms of leaking aneurysm and he knows to report immediately to the emergency room should this occur

## 2012-09-01 NOTE — Addendum Note (Signed)
Addended by: Sharee Pimple on: 09/01/2012 01:24 PM   Modules accepted: Orders

## 2012-12-07 ENCOUNTER — Ambulatory Visit: Payer: Medicare Other | Admitting: Vascular Surgery

## 2013-01-03 ENCOUNTER — Encounter: Payer: Self-pay | Admitting: Vascular Surgery

## 2013-01-04 ENCOUNTER — Encounter (INDEPENDENT_AMBULATORY_CARE_PROVIDER_SITE_OTHER): Payer: Medicare Other | Admitting: *Deleted

## 2013-01-04 ENCOUNTER — Encounter: Payer: Self-pay | Admitting: Vascular Surgery

## 2013-01-04 ENCOUNTER — Ambulatory Visit (INDEPENDENT_AMBULATORY_CARE_PROVIDER_SITE_OTHER): Payer: Medicare Other | Admitting: Vascular Surgery

## 2013-01-04 VITALS — BP 151/80 | HR 51 | Resp 16 | Ht 73.0 in | Wt 189.8 lb

## 2013-01-04 DIAGNOSIS — I714 Abdominal aortic aneurysm, without rupture, unspecified: Secondary | ICD-10-CM

## 2013-01-04 NOTE — Progress Notes (Signed)
The patient has today for continued followup of his asymptomatic infrarenal abdominal aortic aneurysm. He had some variation in measurement of his aneurysm size. There had been some question that he was having rapid growth of this. Reimaging studies had suggested this was not the case. His most recent imaging in November of 2013 revealed a 5.1 cm aneurysm by CT scan. He also has a 3.4 cm iliac artery aneurysm. He has remained and has a stable frail health. He is able to do his activities of daily living without difficulty.  Past Medical History  Diagnosis Date  . CAD (coronary artery disease)     S/P CABG   . Dyslipidemia   . Abdominal aortic aneurysm     4.1 x 4.2  . Diabetes mellitus   . HTN (hypertension) 07/04/2011    History  Substance Use Topics  . Smoking status: Former Smoker -- 0.50 packs/day    Types: Cigarettes    Quit date: 10/26/1961  . Smokeless tobacco: Never Used  . Alcohol Use: Yes     Comment: rarely    Family History  Problem Relation Age of Onset  . Heart disease Father   . Cancer Sister   . Heart disease Sister   . Hypertension Sister   . Cancer Brother   . Hypertension Daughter     No Known Allergies  Current outpatient prescriptions:aspirin 81 MG tablet, Take 81 mg by mouth 2 (two) times daily.  , Disp: , Rfl: ;  atorvastatin (LIPITOR) 10 MG tablet, Take 10 mg by mouth daily.  , Disp: , Rfl: ;  donepezil (ARICEPT) 10 MG tablet, Take 1 tablet (10 mg total) by mouth at bedtime., Disp: 90 tablet, Rfl: 3;  escitalopram (LEXAPRO) 20 MG tablet, Take 20 mg by mouth daily.  , Disp: , Rfl:  memantine (NAMENDA TITRATION PACK) tablet pack, Take by mouth See admin instructions. Take as directed., Disp: , Rfl: ;  metoprolol (LOPRESSOR) 50 MG tablet, Take 50 mg by mouth 2 (two) times daily.  , Disp: , Rfl: ;  Multiple Vitamin (MULTIVITAMIN) tablet, Take 1 tablet by mouth daily.  , Disp: , Rfl:   BP 151/80  Pulse 51  Resp 16  Ht 6\' 1"  (1.854 m)  Wt 189 lb 12.8 oz  (86.093 kg)  BMI 25.05 kg/m2  Body mass index is 25.05 kg/(m^2).       Is exam a well-developed thin gentleman in no acute distress Chest clear bilaterally without wheezes Abdomen soft nontender prominent aortic pulsation with no tenderness over the aneurysm Status 2+ radial 2+ femoral and 2+ popliteal pulses without evidence of peripheral artery aneurysm Heart is regular rate and rhythm  Duplex ultrasound today of his aorta revealed maximum diameter 5.0 cm.  Impression and plan stable 5.0 cm aneurysm with no evidence of growth since November 2013. Discuss this at length with the patient and his wife present. Explained symptoms of leaking aneurysm venous repeat immediately to [facility]  hospital should this occur. Understands that this is low risk. He'll be seen again in 6 month intervals with repeat ultrasound.

## 2013-01-04 NOTE — Addendum Note (Signed)
Addended by: Adria Dill L on: 01/04/2013 10:26 AM   Modules accepted: Orders

## 2013-01-12 ENCOUNTER — Other Ambulatory Visit: Payer: Self-pay | Admitting: *Deleted

## 2013-01-12 DIAGNOSIS — I714 Abdominal aortic aneurysm, without rupture: Secondary | ICD-10-CM

## 2013-02-02 ENCOUNTER — Encounter: Payer: Self-pay | Admitting: Cardiology

## 2013-02-02 ENCOUNTER — Ambulatory Visit (INDEPENDENT_AMBULATORY_CARE_PROVIDER_SITE_OTHER): Payer: Medicare Other | Admitting: Cardiology

## 2013-02-02 VITALS — BP 140/77 | HR 61 | Ht 74.0 in | Wt 192.0 lb

## 2013-02-02 DIAGNOSIS — I714 Abdominal aortic aneurysm, without rupture: Secondary | ICD-10-CM

## 2013-02-02 DIAGNOSIS — I2510 Atherosclerotic heart disease of native coronary artery without angina pectoris: Secondary | ICD-10-CM

## 2013-02-02 DIAGNOSIS — I10 Essential (primary) hypertension: Secondary | ICD-10-CM

## 2013-02-02 DIAGNOSIS — I6523 Occlusion and stenosis of bilateral carotid arteries: Secondary | ICD-10-CM

## 2013-02-02 DIAGNOSIS — I658 Occlusion and stenosis of other precerebral arteries: Secondary | ICD-10-CM

## 2013-02-02 NOTE — Progress Notes (Signed)
HPI He returns for yearly follow up of CAD.  Since I last saw him he has had no new cardiac complaints. He tries to remain a little bit active though he is not exercising routinely. The patient denies any new symptoms such as chest discomfort, neck or arm discomfort. There has been no new shortness of breath, PND or orthopnea. There have been no reported palpitations, presyncope or syncope.  No Known Allergies  Current Outpatient Prescriptions  Medication Sig Dispense Refill  . aspirin 81 MG tablet Take 81 mg by mouth 2 (two) times daily.        Marland Kitchen atorvastatin (LIPITOR) 10 MG tablet Take 10 mg by mouth daily.        Marland Kitchen donepezil (ARICEPT) 10 MG tablet Take 1 tablet (10 mg total) by mouth at bedtime.  90 tablet  3  . escitalopram (LEXAPRO) 20 MG tablet Take 20 mg by mouth daily.        . memantine (NAMENDA TITRATION PACK) tablet pack Take by mouth See admin instructions. Take as directed.      Marland Kitchen METFORMIN HCL PO Take by mouth.      . metoprolol (LOPRESSOR) 50 MG tablet Take 50 mg by mouth 2 (two) times daily.        . Multiple Vitamin (MULTIVITAMIN) tablet Take 1 tablet by mouth daily.         No current facility-administered medications for this visit.    Past Medical History  Diagnosis Date  . CAD (coronary artery disease)     S/P CABG   . Dyslipidemia   . Abdominal aortic aneurysm     4.1 x 4.2  . Diabetes mellitus   . HTN (hypertension) 07/04/2011    Past Surgical History  Procedure Laterality Date  . Coronary artery bypass graft  1993    RIMA to the RCA, LIMA to the LAD, SVG to diagonal, and SVG to circumflex  . Appendectomy      ROS:  As stated in the HPI and negative for all other systems.  PHYSICAL EXAM BP 140/77  Pulse 61  Ht 6\' 2"  (1.88 m)  Wt 192 lb (87.091 kg)  BMI 24.64 kg/m2 GENERAL:  Well appearing HEENT:  Pupils equal round and reactive, fundi not visualized, oral mucosa unremarkable NECK:  No jugular venous distention, waveform within normal limits,  carotid upstroke brisk and symmetric, no bruits, no thyromegaly LYMPHATICS:  No cervical, inguinal adenopathy LUNGS:  Clear to auscultation bilaterally CHEST:  Well healed sternotomy scar. HEART:  PMI not displaced or sustained,S1 and S2 within normal limits, no S3, no S4, no clicks, no rubs, soft apical systolic murmur early peaking ABD:  Flat, positive bowel sounds normal in frequency in pitch, no bruits, no rebound, no guarding, no midline pulsatile mass, no hepatomegaly, no splenomegaly EXT:  2 plus pulses throughout, no edema, no cyanosis no clubbing SKIN:  No rashes no nodules, actinic keratosis  EKG:  Sinus rhythm, rate 59, axis within normal limits, intervals within normal limits, no acute ST-T wave changes. 02/02/2013  ASSESSMENT AND PLAN  CAD:  The patient has no symptoms. He will continue with aggressive risk reduction. I did mention to him that if he needs repair of an abdominal aortic aneurysm I want to see him prior to this and would likely screen him with a Lexiscan Myoview.  HTN:  The blood pressure is at target. No change in medications is indicated. We will continue with therapeutic lifestyle changes (TLC).  CAROTID STENOSIS:  This was very mild on MRA in the past several months. No change in therapy is indicated.  AAA:  This is now 5.1 cm and is followed by Dr. Arbie Cookey. It has remained stable.  HYPERLIPIDEMIA:  He is not on target statin does but if his LDL is below 100 I would not suggest just changing the dose.  I will defer to Josue Hector, MD

## 2013-02-02 NOTE — Patient Instructions (Addendum)
The current medical regimen is effective;  continue present plan and medications.  Follow up in 1 year with Dr Hochrein.  You will receive a letter in the mail 2 months before you are due.  Please call us when you receive this letter to schedule your follow up appointment.  

## 2013-07-25 ENCOUNTER — Encounter: Payer: Self-pay | Admitting: Vascular Surgery

## 2013-07-26 ENCOUNTER — Ambulatory Visit (INDEPENDENT_AMBULATORY_CARE_PROVIDER_SITE_OTHER): Payer: Medicare Other | Admitting: Vascular Surgery

## 2013-07-26 ENCOUNTER — Ambulatory Visit (HOSPITAL_COMMUNITY)
Admission: RE | Admit: 2013-07-26 | Discharge: 2013-07-26 | Disposition: A | Discharge status: Home / Self Care | Payer: Medicare Other | Source: Ambulatory Visit | Attending: Vascular Surgery | Admitting: Vascular Surgery

## 2013-07-26 ENCOUNTER — Encounter: Payer: Self-pay | Admitting: Vascular Surgery

## 2013-07-26 VITALS — BP 162/86 | HR 53 | Resp 16 | Ht 74.0 in | Wt 193.0 lb

## 2013-07-26 DIAGNOSIS — I714 Abdominal aortic aneurysm, without rupture, unspecified: Secondary | ICD-10-CM | POA: Insufficient documentation

## 2013-07-26 DIAGNOSIS — Z48812 Encounter for surgical aftercare following surgery on the circulatory system: Secondary | ICD-10-CM | POA: Insufficient documentation

## 2013-07-26 NOTE — Addendum Note (Signed)
Addended by: Adria Dill L on: 07/26/2013 04:04 PM   Modules accepted: Orders

## 2013-07-26 NOTE — Progress Notes (Signed)
The patient presents today for continued followup of his asymptomatic abdominal aortic aneurysm. He is here today with his wife. He looks quite good. He has had no new medical problems. He has no symptoms referable to his aneurysm. He does remain quite active.  Past Medical History  Diagnosis Date  . CAD (coronary artery disease)     S/P CABG   . Dyslipidemia   . Abdominal aortic aneurysm   . Diabetes mellitus   . HTN (hypertension) 07/04/2011    History  Substance Use Topics  . Smoking status: Former Smoker -- 0.50 packs/day    Types: Cigarettes    Quit date: 10/26/1961  . Smokeless tobacco: Never Used  . Alcohol Use: Yes     Comment: rarely    Family History  Problem Relation Age of Onset  . Heart disease Father     Heart Disease before age 57  . Deep vein thrombosis Father     Varicose Veins  . Cancer Sister   . Heart disease Sister   . Hypertension Sister   . Cancer Brother   . Hypertension Daughter   . Deep vein thrombosis Mother     Varicose Veins  . Heart attack Mother   . Hypertension Sister     No Known Allergies  Current outpatient prescriptions:aspirin 81 MG tablet, Take 81 mg by mouth 2 (two) times daily.  , Disp: , Rfl: ;  atorvastatin (LIPITOR) 10 MG tablet, Take 10 mg by mouth daily.  , Disp: , Rfl: ;  donepezil (ARICEPT) 10 MG tablet, Take 1 tablet (10 mg total) by mouth at bedtime., Disp: 90 tablet, Rfl: 3;  escitalopram (LEXAPRO) 20 MG tablet, Take 20 mg by mouth daily.  , Disp: , Rfl:  memantine (NAMENDA TITRATION PACK) tablet pack, Take by mouth See admin instructions. Take as directed., Disp: , Rfl: ;  METFORMIN HCL PO, Take by mouth., Disp: , Rfl: ;  metoprolol (LOPRESSOR) 50 MG tablet, Take 50 mg by mouth 2 (two) times daily.  , Disp: , Rfl: ;  Multiple Vitamin (MULTIVITAMIN) tablet, Take 1 tablet by mouth daily.  , Disp: , Rfl:   BP 162/86  Pulse 53  Resp 16  Ht 6\' 2"  (1.88 m)  Wt 193 lb (87.544 kg)  BMI 24.77 kg/m2  SpO2 97%  Body mass index  is 24.77 kg/(m^2).       Physical exam well-developed well-nourished gentleman in no acute distress Respirations equal and nonlabored Pulse status: 2+ radial 2+ femoral 2+ popliteal 2+ posterior tibial pulses with no evidence of peripheral aneurysm Abdomen soft nontender no masses noted and no palpable aneurysm  Ultrasound today reveals no change and the maximal diameter of his aneurysm. Projection today is 4.8 where it was 4.96 months ago.  Impression and plan infrarenal abdominal aortic aneurysm which is asymptomatic and this showed no growth in 6 months. I again explained symptoms of leaking aneurysm the knee to present immediately to [facility]. We will see him again in 6 months with repeat ultrasound of his aorta. He is also having is a carotid duplex and another facility and Ginette Otto is requested that we take over this followup in 6 months as well.

## 2013-07-27 NOTE — Addendum Note (Signed)
Addended by: Adria Dill L on: 07/27/2013 09:26 AM   Modules accepted: Orders

## 2013-08-02 ENCOUNTER — Encounter (HOSPITAL_COMMUNITY): Payer: Medicare Other

## 2013-08-04 ENCOUNTER — Ambulatory Visit (HOSPITAL_COMMUNITY): Payer: Medicare Other | Attending: Cardiology

## 2013-08-04 DIAGNOSIS — E785 Hyperlipidemia, unspecified: Secondary | ICD-10-CM | POA: Insufficient documentation

## 2013-08-04 DIAGNOSIS — I658 Occlusion and stenosis of other precerebral arteries: Secondary | ICD-10-CM | POA: Insufficient documentation

## 2013-08-04 DIAGNOSIS — E119 Type 2 diabetes mellitus without complications: Secondary | ICD-10-CM | POA: Insufficient documentation

## 2013-08-04 DIAGNOSIS — I2510 Atherosclerotic heart disease of native coronary artery without angina pectoris: Secondary | ICD-10-CM | POA: Insufficient documentation

## 2013-08-04 DIAGNOSIS — Z8673 Personal history of transient ischemic attack (TIA), and cerebral infarction without residual deficits: Secondary | ICD-10-CM | POA: Insufficient documentation

## 2013-08-04 DIAGNOSIS — I10 Essential (primary) hypertension: Secondary | ICD-10-CM | POA: Insufficient documentation

## 2013-08-04 DIAGNOSIS — Z951 Presence of aortocoronary bypass graft: Secondary | ICD-10-CM | POA: Insufficient documentation

## 2013-08-04 DIAGNOSIS — I6529 Occlusion and stenosis of unspecified carotid artery: Secondary | ICD-10-CM

## 2014-02-13 ENCOUNTER — Encounter: Payer: Self-pay | Admitting: Family

## 2014-02-14 ENCOUNTER — Ambulatory Visit (HOSPITAL_COMMUNITY)
Admission: RE | Admit: 2014-02-14 | Discharge: 2014-02-14 | Disposition: A | Discharge status: Home / Self Care | Payer: Medicare Other | Source: Ambulatory Visit | Attending: Family | Admitting: Family

## 2014-02-14 ENCOUNTER — Ambulatory Visit (INDEPENDENT_AMBULATORY_CARE_PROVIDER_SITE_OTHER): Payer: Medicare Other | Admitting: Family

## 2014-02-14 ENCOUNTER — Encounter: Payer: Self-pay | Admitting: Family

## 2014-02-14 ENCOUNTER — Ambulatory Visit (INDEPENDENT_AMBULATORY_CARE_PROVIDER_SITE_OTHER)
Admission: RE | Admit: 2014-02-14 | Discharge: 2014-02-14 | Disposition: A | Discharge status: Home / Self Care | Payer: Medicare Other | Source: Ambulatory Visit | Attending: Family | Admitting: Family

## 2014-02-14 VITALS — BP 147/80 | HR 50 | Resp 14 | Ht 73.5 in | Wt 197.0 lb

## 2014-02-14 DIAGNOSIS — I714 Abdominal aortic aneurysm, without rupture, unspecified: Secondary | ICD-10-CM

## 2014-02-14 DIAGNOSIS — I6529 Occlusion and stenosis of unspecified carotid artery: Secondary | ICD-10-CM

## 2014-02-14 DIAGNOSIS — Z48812 Encounter for surgical aftercare following surgery on the circulatory system: Secondary | ICD-10-CM

## 2014-02-14 NOTE — Progress Notes (Signed)
[facility] HISTORY AND PHYSICAL   MRN : [id number]  History of Present Illness:   Juan Patel is a 80 y.o. male patient of Dr. Arbie Cookey. The patient presents today for continued followup of his asymptomatic abdominal aortic aneurysm and to assume follow up of his carotid artery stenosis from another provider, at patient's request.  Patient denies any abdominal or back pain, denies any history of stroke or TIA. He denies any barrier to walking, but does not walk much or exercise, advised 30 minutes moderate exercise at least 5 days/week. Patient denies claudication symptoms with walking. He has CAD and is s/p 4 vessel CABG in 1993.  Pt Diabetic: Yes Pt smoker: former smoker, quit in 1963  Pt meds include: Statin :Yes ASA: Yes Other anticoagulants/antiplatelets: no  Current Outpatient Prescriptions  Medication Sig Dispense Refill  . aspirin 81 MG tablet Take 81 mg by mouth 2 (two) times daily.        Marland Kitchen atorvastatin (LIPITOR) 10 MG tablet Take 10 mg by mouth daily.        Marland Kitchen donepezil (ARICEPT) 10 MG tablet Take 1 tablet (10 mg total) by mouth at bedtime.  90 tablet  3  . escitalopram (LEXAPRO) 20 MG tablet Take 20 mg by mouth daily.        . memantine (NAMENDA TITRATION PACK) tablet pack Take by mouth See admin instructions. Take as directed.      Marland Kitchen METFORMIN HCL PO Take by mouth.      . metoprolol (LOPRESSOR) 50 MG tablet Take 50 mg by mouth 2 (two) times daily.        . Multiple Vitamin (MULTIVITAMIN) tablet Take 1 tablet by mouth daily.         No current facility-administered medications for this visit.     Past Medical History  Diagnosis Date  . CAD (coronary artery disease)     S/P CABG   . Dyslipidemia   . Abdominal aortic aneurysm   . Diabetes mellitus   . HTN (hypertension) 07/04/2011    Past Surgical History  Procedure Laterality Date  . Appendectomy    . Coronary artery bypass graft  Oct. 1993    RIMA to the RCA, LIMA to the LAD,  SVG to diagonal, and SVG to circumflex    Social History History  Substance Use Topics  . Smoking status: Former Smoker -- 0.50 packs/day    Types: Cigarettes    Quit date: 10/26/1961  . Smokeless tobacco: Never Used  . Alcohol Use: Yes     Comment: rarely    Family History Family History  Problem Relation Age of Onset  . Heart disease Father     Heart Disease before age 3  . Deep vein thrombosis Father     Varicose Veins  . Cancer Sister   . Heart disease Sister   . Hypertension Sister   . Cancer Brother   . Hypertension Daughter   . Deep vein thrombosis Mother     Varicose Veins  . Heart attack Mother   . Hypertension Sister     No Known Allergies   REVIEW OF SYSTEMS: See HPI for pertinent positives and negatives.  Physical Examination Filed Vitals:   02/14/14 1000 02/14/14 1003  BP: 136/73 147/80  Pulse: 50 50  Resp:  14  Height:  6' 1.5" (1.867 m)  Weight:  197 lb (89.359 kg)  SpO2:  96%  Body mass index is 25.64 kg/(m^2).  General:  WDWN in NAD Gait: Normal HENT: WNL Eyes: Pupils equal Pulmonary: normal non-labored breathing , without Rales, rhonchi,  wheezing Cardiac: RRR, no murmurs detected  Abdomen: soft, NT, no masses Skin: no rashes, ulcers noted;  no Gangrene , no cellulitis; no open wounds;   VASCULAR EXAM  Carotid Bruits Left Right   Negative Negative                             VASCULAR EXAM: Extremities without ischemic changes  without Gangrene; without open wounds.                                                                                                          LE Pulses LEFT RIGHT       FEMORAL  3+ palpable  3+ palpable        POPLITEAL  not palpable   not palpable       POSTERIOR TIBIAL  2+ palpable   2+ palpable        DORSALIS PEDIS      ANTERIOR TIBIAL 2+ palpable  2+ palpable     Musculoskeletal: no muscle wasting or atrophy; no edema  Neurologic: A&O X 3; Appropriate Affect ;  SENSATION:  normal; MOTOR FUNCTION: 5/5, in UE's, 4/5 in LE's, Symmetric , CN 2-12 intact, except has mild hearing loss. Speech is fluent/normal   Non-Invasive Vascular Imaging (02/14/2014):   ABDOMINAL AORTA DUPLEX EVALUATION    INDICATION: Follow up evaluation of abdominal aortic aneurysm    PREVIOUS INTERVENTION(S): None    DUPLEX EXAM: Limited duplex of the aorta    LOCATION DIAMETER AP (cm) DIAMETER TRANSVERSE (cm) VELOCITIES (cm/sec)  Aorta Proximal 2.3 2.2 70  Aorta Mid 3.8 - 81  Aorta Distal 5.0 4.8 36  Right Common Iliac Artery 1.2 - 117  Left Common Iliac Artery 2.2 - -    Previous max aortic diameter:  4.8 Date: 07/26/2013     ADDITIONAL FINDINGS: 6.6 x 7.1 cm cyst on right kidney    IMPRESSION: 1. Multilobular aneurysm with the largest measurement in the distal aorta measuring 5.0 x 4.8 cm.    Compared to the previous exam:  Slight increase of size.   CEREBROVASCULAR DUPLEX EVALUATION    INDICATION: Carotid artery disease    PREVIOUS INTERVENTION(S): None    DUPLEX EXAM: Carotid duplex    RIGHT  LEFT  Peak Systolic Velocities (cm/s) End Diastolic Velocities (cm/s) Plaque LOCATION Peak Systolic Velocities (cm/s) End Diastolic Velocities (cm/s) Plaque  79 12 - CCA PROXIMAL 79 12 -  80 13 HT CCA MID 85 15 HT  65 11 HT CCA DISTAL 60 13 HT  70 8 - ECA 68 6 -  45 12 HT ICA PROXIMAL 33 8 HT  55 17 - ICA MID 49 15 -  57 14 - ICA DISTAL 51 14 -    .71 ICA / CCA Ratio (PSV) 0.6  Antegrade Vertebral Flow Antegrade  140 Brachial Systolic Pressure (mmHg) 130  Triphasic Brachial Artery Waveforms Triphasic  Plaque Morphology:  HM = Homogeneous, HT = Heterogeneous, CP = Calcific Plaque, SP = Smooth Plaque, IP = Irregular Plaque     ADDITIONAL FINDINGS:     IMPRESSION: 1. Less than 40% internal carotid artery stenosis bilaterally.    Compared to the previous exam:  No change from prior exam.     ASSESSMENT:  Juan Patel is a 80 y.o. male who presents with  asymptomatic abdominal aortic aneurysm and a history of minimal internal carotid artery stenosis and CAD. Patient denies any abdominal or back pain, denies any history of stroke or TIA. His bilateral ICA stenosis remains minimal. His AAA has increased in size slightly in six months from 4.8 to 5.0 cm at the largest diameter.   PLAN:   Based on today's exam and non-invasive vascular lab results, and after discussing with Dr. Arbie CookeyEarly,  the patient will follow up in 6 months with the following tests AAA Duplex, and 1 year for carotid Duplex. I discussed in depth with the patient the nature of atherosclerosis, and emphasized the importance of maximal medical management including strict control of blood pressure, blood glucose, and lipid levels, obtaining regular exercise, and continued cessation of smoking.  The patient is aware that without maximal medical management the underlying atherosclerotic disease process will progress, limiting the benefit of any interventions. The patient was given information about stroke prevention and what symptoms should prompt the patient to seek immediate medical care. The patient was given information about AAA including signs, symptoms, treatment,  what symptoms should prompt the patient to seek immediate medical care, and how to minimize the risk of enlargement and rupture of aneurysms. . Thank you for allowing us to participate in this patient's care.  Charisse MarchSuzanne Kyliegh Jester, RN, MSN, FNP-C [facility] Office: [phone]  Clinic MD: Early 02/14/2014 9:29 AM

## 2014-02-14 NOTE — Patient Instructions (Signed)
Abdominal Aortic Aneurysm An aneurysm is a weakened or damaged part of an artery wall that bulges from the normal force of blood pumping through the body. An abdominal aortic aneurysm is an aneurysm that occurs in the lower part of the aorta, the main artery of the body.  The major concern with an abdominal aortic aneurysm is that it can enlarge and burst (rupture) or blood can flow between the layers of the wall of the aorta through a tear (aorticdissection). Both of these conditions can cause bleeding inside the body and can be life threatening unless diagnosed and treated promptly. CAUSES  The exact cause of an abdominal aortic aneurysm is unknown. Some contributing factors are:   A hardening of the arteries caused by the buildup of fat and other substances in the lining of a blood vessel (arteriosclerosis).  Inflammation of the walls of an artery (arteritis).   Connective tissue diseases, such as Marfan syndrome.   Abdominal trauma.   An infection, such as syphilis or staphylococcus, in the wall of the aorta (infectious aortitis) caused by bacteria. RISK FACTORS  Risk factors that contribute to an abdominal aortic aneurysm may include:  Age older than 60 years.   High blood pressure (hypertension).  Male gender.  Ethnicity (white race).  Obesity.  Family history of aneurysm (first degree relatives only).  Tobacco use. PREVENTION  The following healthy lifestyle habits may help decrease your risk of abdominal aortic aneurysm:  Quitting smoking. Smoking can raise your blood pressure and cause arteriosclerosis.  Limiting or avoiding alcohol.  Keeping your blood pressure, blood sugar level, and cholesterol levels within normal limits.  Decreasing your salt intake. In somepeople, too much salt can raise blood pressure and increase your risk of abdominal aortic aneurysm.  Eating a diet low in saturated fats and cholesterol.  Increasing your fiber intake by including  whole grains, vegetables, and fruits in your diet. Eating these foods may help lower blood pressure.  Maintaining a healthy weight.  Staying physically active and exercising regularly. SYMPTOMS  The symptoms of abdominal aortic aneurysm may vary depending on the size and rate of growth of the aneurysm.Most grow slowly and do not have any symptoms. When symptoms do occur, they may include:  Pain (abdomen, side, lower back, or groin). The pain may vary in intensity. A sudden onset of severe pain may indicate that the aneurysm has ruptured.  Feeling full after eating only small amounts of food.  Nausea or vomiting or both.  Feeling a pulsating lump in the abdomen.  Feeling faint or passing out. DIAGNOSIS  Since most unruptured abdominal aortic aneurysms have no symptoms, they are often discovered during diagnostic exams for other conditions. An aneurysm may be found during the following procedures:  Ultrasonography (A one-time screening for abdominal aortic aneurysm by ultrasonography is also recommended for all men aged 65-75 years who have ever smoked).  X-ray exams.  A computed tomography (CT).  Magnetic resonance imaging (MRI).  Angiography or arteriography. TREATMENT  Treatment of an abdominal aortic aneurysm depends on the size of your aneurysm, your age, and risk factors for rupture. Medication to control blood pressure and pain may be used to manage aneurysms smaller than 6 cm. Regular monitoring for enlargement may be recommended by your caregiver if:  The aneurysm is 3 4 cm in size (an annual ultrasonography may be recommended).  The aneurysm is 4 4.5 cm in size (an ultrasonography every 6 months may be recommended).  The aneurysm is larger than 4.5   cm in size (your caregiver may ask that you be examined by a vascular surgeon). If your aneurysm is larger than 6 cm, surgical repair may be recommended. There are two main methods for repair of an aneurysm:   Endovascular  repair (a minimally invasive surgery). This is done most often.  Open repair. This method is used if an endovascular repair is not possible. Document Released: 07/02/2005 Document Revised: 01/17/2013 Document Reviewed: 10/22/2012 [employer] Patient Information 2014 [employer].   Stroke Prevention Some medical conditions and behaviors are associated with an increased chance of having a stroke. You may prevent a stroke by making healthy choices and managing medical conditions. HOW CAN I REDUCE MY RISK OF HAVING A STROKE?   Stay physically active. Get at least 30 minutes of activity on most or all days.  Do not smoke. It may also be helpful to avoid exposure to secondhand smoke.  Limit alcohol use. Moderate alcohol use is considered to be:  No more than 2 drinks per day for men.  No more than 1 drink per day for nonpregnant women.  Eat healthy foods. This involves  Eating 5 or more servings of fruits and vegetables a day.  Following a diet that addresses high blood pressure (hypertension), high cholesterol, diabetes, or obesity.  Manage your cholesterol levels.  A diet low in saturated fat, trans fat, and cholesterol and high in fiber may control cholesterol levels.  Take any prescribed medicines to control cholesterol as directed by your health care provider.  Manage your diabetes.  A controlled-carbohydrate, controlled-sugar diet is recommended to manage diabetes.  Take any prescribed medicines to control diabetes as directed by your health care provider.  Control your hypertension.  A low-salt (sodium), low-saturated fat, low-trans fat, and low-cholesterol diet is recommended to manage hypertension.  Take any prescribed medicines to control hypertension as directed by your health care provider.  Maintain a healthy weight.  A reduced-calorie, low-sodium, low-saturated fat, low-trans fat, low-cholesterol diet is recommended to manage weight.  Stop drug  abuse.  Avoid taking birth control pills.  Talk to your health care provider about the risks of taking birth control pills if you are over 35 years old, smoke, get migraines, or have ever had a blood clot.  Get evaluated for sleep disorders (sleep apnea).  Talk to your health care provider about getting a sleep evaluation if you snore a lot or have excessive sleepiness.  Take medicines as directed by your health care provider.  For some people, aspirin or blood thinners (anticoagulants) are helpful in reducing the risk of forming abnormal blood clots that can lead to stroke. If you have the irregular heart rhythm of atrial fibrillation, you should be on a blood thinner unless there is a good reason you cannot take them.  Understand all your medicine instructions.  Make sure that other other conditions (such as anemia or atherosclerosis) are addressed. SEEK IMMEDIATE MEDICAL CARE IF:   You have sudden weakness or numbness of the face, arm, or leg, especially on one side of the body.  Your face or eyelid droops to one side.  You have sudden confusion.  You have trouble speaking (aphasia) or understanding.  You have sudden trouble seeing in one or both eyes.  You have sudden trouble walking.  You have dizziness.  You have a loss of balance or coordination.  You have a sudden, severe headache with no known cause.  You have new chest pain or an irregular heartbeat. Any of these symptoms   may represent a serious problem that is an emergency. Do not wait to see if the symptoms will go away. Get medical help at once. Call your local emergency services  (911 in [place] Do not drive yourself to the hospital. Document Released: 10/30/2004 Document Revised: 07/13/2013 Document Reviewed: 03/25/2013 [employer] Patient Information 2014 [employer].  

## 2014-03-10 ENCOUNTER — Telehealth: Payer: Self-pay | Admitting: Cardiology

## 2014-03-15 ENCOUNTER — Ambulatory Visit: Payer: Medicare Other | Admitting: Cardiology

## 2014-03-31 ENCOUNTER — Encounter: Payer: Self-pay | Admitting: Cardiology

## 2014-03-31 ENCOUNTER — Ambulatory Visit (INDEPENDENT_AMBULATORY_CARE_PROVIDER_SITE_OTHER): Payer: Medicare Other | Admitting: Cardiology

## 2014-03-31 VITALS — BP 143/75 | HR 57 | Ht 73.5 in | Wt 190.0 lb

## 2014-03-31 DIAGNOSIS — I714 Abdominal aortic aneurysm, without rupture, unspecified: Secondary | ICD-10-CM

## 2014-03-31 DIAGNOSIS — I6529 Occlusion and stenosis of unspecified carotid artery: Secondary | ICD-10-CM

## 2014-03-31 DIAGNOSIS — I10 Essential (primary) hypertension: Secondary | ICD-10-CM

## 2014-03-31 DIAGNOSIS — I2510 Atherosclerotic heart disease of native coronary artery without angina pectoris: Secondary | ICD-10-CM

## 2014-03-31 NOTE — Progress Notes (Signed)
HPI He returns for yearly follow up of CAD.  Since I last saw him he has had no new cardiac complaints. He patient denies any new symptoms such as chest discomfort, neck or arm discomfort. There has been no new shortness of breath, PND or orthopnea. There have been no reported palpitations, presyncope or syncope.  Of note he says he's not on beta blocker but he doesn't remember when this was stopped. He says his aneurysm has been followed and has been stable. He gets around slowly but still gets around.  No Known Allergies  Current Outpatient Prescriptions  Medication Sig Dispense Refill  . aspirin 81 MG tablet Take 81 mg by mouth 2 (two) times daily.        Marland Kitchen. atorvastatin (LIPITOR) 10 MG tablet Take 10 mg by mouth daily.        Marland Kitchen. donepezil (ARICEPT) 10 MG tablet Take 1 tablet (10 mg total) by mouth at bedtime.  90 tablet  3  . escitalopram (LEXAPRO) 20 MG tablet Take 20 mg by mouth daily.        . memantine (NAMENDA TITRATION PACK) tablet pack Take 28 mg by mouth See admin instructions. Take as directed.  One Tab daily      . METFORMIN HCL PO Take 500 mg by mouth daily.       . metoprolol (LOPRESSOR) 50 MG tablet Take 50 mg by mouth 2 (two) times daily.        . Multiple Vitamin (MULTIVITAMIN) tablet Take 1 tablet by mouth daily.         No current facility-administered medications for this visit.    Past Medical History  Diagnosis Date  . CAD (coronary artery disease)     S/P CABG   . Dyslipidemia   . Abdominal aortic aneurysm   . Diabetes mellitus   . HTN (hypertension) 07/04/2011    Past Surgical History  Procedure Laterality Date  . Appendectomy    . Coronary artery bypass graft  Oct. 1993    RIMA to the RCA, LIMA to the LAD, SVG to diagonal, and SVG to circumflex    ROS:  As stated in the HPI and negative for all other systems.  PHYSICAL EXAM BP 143/75  Pulse 57  Ht 6' 1.5" (1.867 m)  Wt 190 lb (86.183 kg)  BMI 24.72 kg/m2 GENERAL:  Well appearing HEENT:  Pupils  equal round and reactive, fundi not visualized, oral mucosa unremarkable NECK:  No jugular venous distention, waveform within normal limits, carotid upstroke brisk and symmetric, no bruits, no thyromegaly LYMPHATICS:  No cervical, inguinal adenopathy LUNGS:  Clear to auscultation bilaterally CHEST:  Well healed sternotomy scar. HEART:  PMI not displaced or sustained,S1 and S2 within normal limits, no S3, no S4, no clicks, no rubs, soft apical systolic murmur early peaking ABD:  Flat, positive bowel sounds normal in frequency in pitch, no bruits, no rebound, no guarding, no midline pulsatile mass, no hepatomegaly, no splenomegaly EXT:  2 plus pulses throughout, no edema, no cyanosis no clubbing SKIN:  No rashes no nodules, actinic keratosis  EKG:  Sinus rhythm, rate 53, axis within normal limits, intervals within normal limits, no acute ST-T wave changes. 03/31/2014  ASSESSMENT AND PLAN  CAD:  The patient has no symptoms. He will continue with aggressive risk reduction. I did mention to him that if he needs repair of an abdominal aortic aneurysm I want to see him prior to this and would possibly screen him with a  Lexiscan Myoview.  HTN:  The blood pressure is at target. No change in medications is indicated. We will continue with therapeutic lifestyle changes (TLC).  CAROTID STENOSIS:  This is followed by Dr. Arbie CookeyEarly.   AAA:  This is  followed by Dr. Arbie CookeyEarly. It has remained stable.  HYPERLIPIDEMIA:    I will defer to Josue HectorNYLAND,LEONARD Kerri, MD

## 2014-03-31 NOTE — Patient Instructions (Signed)
The current medical regimen is effective;  continue present plan and medications.  Follow up in 1 year with Dr Hochrein.  You will receive a letter in the mail 2 months before you are due.  Please call us when you receive this letter to schedule your follow up appointment.  

## 2014-07-21 ENCOUNTER — Telehealth: Payer: Self-pay

## 2014-07-21 DIAGNOSIS — I714 Abdominal aortic aneurysm, without rupture, unspecified: Secondary | ICD-10-CM

## 2014-07-21 NOTE — Telephone Encounter (Signed)
Phone call from NP @ [facility].  Reported pt. had a change in the size of his AAA.  Stated the AAA was 5.1 cm. and has increased to 5.8 cm, by recent ultrasound.  Requested appt. For evaluation.  Discussed with Dr. Darrick PennaFields, MD in office.  Recommended appt. for CTA Abd/ Pelvis to further eval. AAA. Call placed to pt.  Spoke with wife. Informed of need to obtain CTA Abd/ Pelvis per recommendation Dr. Darrick PennaFields.  Questioned if pt. Is having any abdominal or back pain.  Stated she was not aware of any symptoms.  Wife stated pt. is very concerned, and would like to have appt. ASAP.  Advised will contact pt. with appt.

## 2014-07-28 ENCOUNTER — Ambulatory Visit
Admission: RE | Admit: 2014-07-28 | Discharge: 2014-07-28 | Disposition: A | Discharge status: Home / Self Care | Payer: Medicare Other | Source: Ambulatory Visit | Attending: Vascular Surgery | Admitting: Vascular Surgery

## 2014-07-28 ENCOUNTER — Ambulatory Visit (HOSPITAL_COMMUNITY)
Admission: RE | Admit: 2014-07-28 | Discharge: 2014-07-28 | Disposition: A | Discharge status: Home / Self Care | Payer: Medicare Other | Source: Ambulatory Visit | Attending: Surgery | Admitting: Surgery

## 2014-07-28 DIAGNOSIS — I6523 Occlusion and stenosis of bilateral carotid arteries: Secondary | ICD-10-CM | POA: Insufficient documentation

## 2014-07-28 DIAGNOSIS — I714 Abdominal aortic aneurysm, without rupture, unspecified: Secondary | ICD-10-CM

## 2014-07-28 MED ORDER — IOHEXOL 350 MG/ML SOLN
80.0000 mL | Freq: Once | INTRAVENOUS | Status: AC | PRN
  Administered 2014-07-28: 80 mL via INTRAVENOUS

## 2014-07-31 ENCOUNTER — Encounter: Payer: Self-pay | Admitting: Vascular Surgery

## 2014-08-01 ENCOUNTER — Ambulatory Visit (INDEPENDENT_AMBULATORY_CARE_PROVIDER_SITE_OTHER): Payer: Medicare Other | Admitting: Vascular Surgery

## 2014-08-01 ENCOUNTER — Encounter: Payer: Self-pay | Admitting: Vascular Surgery

## 2014-08-01 VITALS — BP 143/68 | HR 53 | Resp 18 | Ht 74.0 in | Wt 194.0 lb

## 2014-08-01 DIAGNOSIS — I714 Abdominal aortic aneurysm, without rupture, unspecified: Secondary | ICD-10-CM | POA: Insufficient documentation

## 2014-08-01 DIAGNOSIS — I6529 Occlusion and stenosis of unspecified carotid artery: Secondary | ICD-10-CM

## 2014-08-01 NOTE — Progress Notes (Signed)
Patient name: Juan BottomRobert T Hechler MRN: [id number] DOB: October 21, 1925 Sex: male   Referred by: Lysbeth GalasNyland  Reason for referral:  Chief Complaint  Patient presents with  . AAA    6 month f/u  CT 07/28/14    HISTORY OF PRESENT ILLNESS: The patient presents today for further discussion of his infrarenal abdominal aortic aneurysm. He has been followed in our office with serial studies. This has shown some continued progression in size. Most recently he underwent a duplex in May of this year showing a maximal diameter 5.0 cm. He recently underwent a life line screening showing the size of 5.8 cm. He underwent a CAT scan for further evaluation and we are here today for discussion of this. He is quite active for his age of 80. He is here today with his wife. He reports no major medical difficulties. He does have stable coronary disease. His aneurysm  Past Medical History  Diagnosis Date  . CAD (coronary artery disease)     S/P CABG   . Dyslipidemia   . Abdominal aortic aneurysm   . Diabetes mellitus   . HTN (hypertension) 07/04/2011    Past Surgical History  Procedure Laterality Date  . Appendectomy    . Coronary artery bypass graft  Oct. 1993    RIMA to the RCA, LIMA to the LAD, SVG to diagonal, and SVG to circumflex    History   Social History  . Marital Status: Married    Spouse Name: N/A    Number of Children: N/A  . Years of Education: N/A   Occupational History  . Not on file.   Social History Main Topics  . Smoking status: Former Smoker -- 0.50 packs/day    Types: Cigarettes    Quit date: 10/26/1961  . Smokeless tobacco: Never Used  . Alcohol Use: Yes     Comment: rarely  . Drug Use: No  . Sexual Activity: Not on file   Other Topics Concern  . Not on file   Social History Narrative  . No narrative on file    Family History  Problem Relation Age of Onset  . Heart disease Father     Heart Disease before age 78  . Deep vein thrombosis Father     Varicose Veins    . Cancer Sister   . Heart disease Sister   . Hypertension Sister   . Cancer Brother   . Hypertension Daughter   . Deep vein thrombosis Mother     Varicose Veins  . Heart attack Mother   . Hypertension Sister     Allergies as of 08/01/2014  . (No Known Allergies)    Current Outpatient Prescriptions on File Prior to Visit  Medication Sig Dispense Refill  . aspirin 81 MG tablet Take 81 mg by mouth 2 (two) times daily.        Marland Kitchen. atorvastatin (LIPITOR) 10 MG tablet Take 10 mg by mouth daily.        Marland Kitchen. donepezil (ARICEPT) 10 MG tablet Take 1 tablet (10 mg total) by mouth at bedtime.  90 tablet  3  . escitalopram (LEXAPRO) 20 MG tablet Take 20 mg by mouth daily.        . memantine (NAMENDA TITRATION PACK) tablet pack Take 28 mg by mouth See admin instructions. Take as directed.  One Tab daily      . METFORMIN HCL PO Take 500 mg by mouth daily.       . Multiple Vitamin (  MULTIVITAMIN) tablet Take 1 tablet by mouth daily.         No current facility-administered medications on file prior to visit.        PHYSICAL EXAMINATION:  General: The patient is a well-nourished male, in no acute distress. Vital signs are BP 143/68  Pulse 53  Resp 18  Ht 6\' 2"  (1.88 m)  Wt 194 lb (87.998 kg)  BMI 24.90 kg/m2 Pulmonary: There is a good air exchange  Abdomen: Soft and non-tender with no palpable aneurysm Musculoskeletal: There are no major deformities.  There is no significant extremity pain. Neurologic: No focal weakness or paresthesias are detected, Skin: There are no ulcer or rashes noted. Psychiatric: The patient has normal affect. Cardiovascular: Palpable radial femoral and popliteal pulses bilaterally  CT scan was reviewed with the patient. This does show a 5.7 cm infrarenal abdominal aortic aneurysm. He does have an adequate infrarenal neck for stent graft repair. He does have some ectasia of his iliac arteries bilaterally.  Impression and Plan:  5.7 cm asymptomatic infrarenal  abdominal aortic aneurysm. Very long discussion with the patient and his wife. I did explain I feel that he is a candidate for stent graft repair. I have recommended elective repair due to his significant gross and large aneurysm. I explained the expected one night hospitalization if we are able to achieve stent graft repair. We will have him follow-up with Dr. Antoine PocheHochrein cardiac clearance prior to surgery.    Tamitha Norell [facility] [place] Office: [phone]       \

## 2014-08-04 ENCOUNTER — Encounter: Payer: Self-pay | Admitting: Cardiology

## 2014-08-04 ENCOUNTER — Ambulatory Visit (INDEPENDENT_AMBULATORY_CARE_PROVIDER_SITE_OTHER): Payer: Medicare Other | Admitting: Cardiology

## 2014-08-04 ENCOUNTER — Telehealth (HOSPITAL_COMMUNITY): Payer: Self-pay

## 2014-08-04 VITALS — BP 118/72 | HR 50 | Ht 74.0 in | Wt 195.5 lb

## 2014-08-04 DIAGNOSIS — I6529 Occlusion and stenosis of unspecified carotid artery: Secondary | ICD-10-CM

## 2014-08-04 DIAGNOSIS — I6523 Occlusion and stenosis of bilateral carotid arteries: Secondary | ICD-10-CM

## 2014-08-04 DIAGNOSIS — I2510 Atherosclerotic heart disease of native coronary artery without angina pectoris: Secondary | ICD-10-CM

## 2014-08-04 DIAGNOSIS — I10 Essential (primary) hypertension: Secondary | ICD-10-CM

## 2014-08-04 NOTE — Telephone Encounter (Signed)
Encounter complete. 

## 2014-08-04 NOTE — Progress Notes (Signed)
HPI He returns for yearly follow up of CAD.  Since I last saw him he has had no new cardiac complaints. He is being evaluated and is being considered for abdominal aortic aneurysm repair hopefully with stent grafting. He patient denies any new symptoms such as chest discomfort, neck or arm discomfort. There has been no new shortness of breath, PND or orthopnea. There have been no reported palpitations, presyncope or syncope. He is not being as active as I would like.  He does some minimal walking around the house.    No Known Allergies  Current Outpatient Prescriptions  Medication Sig Dispense Refill  . aspirin 81 MG tablet Take 81 mg by mouth 2 (two) times daily.        Marland Kitchen. atorvastatin (LIPITOR) 10 MG tablet Take 10 mg by mouth daily.        Marland Kitchen. donepezil (ARICEPT) 10 MG tablet Take 1 tablet (10 mg total) by mouth at bedtime.  90 tablet  3  . escitalopram (LEXAPRO) 20 MG tablet Take 20 mg by mouth daily.        . memantine (NAMENDA TITRATION PACK) tablet pack Take 28 mg by mouth See admin instructions. Take as directed.  One Tab daily      . METFORMIN HCL PO Take 500 mg by mouth daily.       . metoprolol succinate (TOPROL-XL) 50 MG 24 hr tablet Take 50 mg by mouth 2 (two) times daily.       . Multiple Vitamin (MULTIVITAMIN) tablet Take 1 tablet by mouth daily.         No current facility-administered medications for this visit.    Past Medical History  Diagnosis Date  . CAD (coronary artery disease)     S/P CABG   . Dyslipidemia   . Abdominal aortic aneurysm   . Diabetes mellitus   . HTN (hypertension) 07/04/2011    Past Surgical History  Procedure Laterality Date  . Appendectomy    . Coronary artery bypass graft  Oct. 1993    RIMA to the RCA, LIMA to the LAD, SVG to diagonal, and SVG to circumflex    ROS:  As stated in the HPI and negative for all other systems.  PHYSICAL EXAM BP 118/72  Pulse 50  Ht 6\' 2"  (1.88 m)  Wt 195 lb 8 oz (88.678 kg)  BMI 25.09 kg/m2 GENERAL:   Well appearing HEENT:  Pupils equal round and reactive, fundi not visualized, oral mucosa unremarkable NECK:  No jugular venous distention, waveform within normal limits, carotid upstroke brisk and symmetric, no bruits, no thyromegaly LYMPHATICS:  No cervical, inguinal adenopathy LUNGS:  Clear to auscultation bilaterally CHEST:  Well healed sternotomy scar. HEART:  PMI not displaced or sustained,S1 and S2 within normal limits, no S3, no S4, no clicks, no rubs, soft apical systolic murmur early peaking ABD:  Flat, positive bowel sounds normal in frequency in pitch, no bruits, no rebound, no guarding, no midline pulsatile mass, no hepatomegaly, no splenomegaly EXT:  2 plus pulses throughout, no edema, no cyanosis no clubbing SKIN:  No rashes no nodules, actinic keratosis  EKG:  Sinus rhythm, rate 53, axis within normal limits, intervals within normal limits, no acute ST-T wave changes. 08/04/2014  ASSESSMENT AND PLAN  PREOP:  The patient has 78 year old bypass grafts and a low functional level. Prior to abdominal aortic aneurysm repair he will need stress testing.  He would not be a walk on a treadmill. Therefore, he will  have a [employer] Myoview.  CAD:  As above.  HTN:  The blood pressure is at target. No change in medications is indicated. We will continue with therapeutic lifestyle changes (TLC).  CAROTID STENOSIS:  This is followed by Dr. Arbie CookeyEarly.   AAA:  He is now being considered for aneurysm repair. I reviewed recent office records and ultrasound results.  HYPERLIPIDEMIA:    I will defer to Josue HectorNYLAND,LEONARD Teancum, MD.  I will get a copy of his most recent lipid profile.

## 2014-08-04 NOTE — Patient Instructions (Signed)
Your physician recommends that you schedule a follow-up appointment in:  One year with Dr. Antoine PocheHochrein  We are ordering a stress test for you to get done

## 2014-08-08 ENCOUNTER — Telehealth: Payer: Self-pay | Admitting: *Deleted

## 2014-08-08 ENCOUNTER — Ambulatory Visit (HOSPITAL_COMMUNITY)
Admission: RE | Admit: 2014-08-08 | Discharge: 2014-08-08 | Disposition: A | Discharge status: Home / Self Care | Payer: Medicare Other | Source: Ambulatory Visit | Attending: Cardiovascular Disease | Admitting: Cardiovascular Disease

## 2014-08-08 DIAGNOSIS — I2510 Atherosclerotic heart disease of native coronary artery without angina pectoris: Secondary | ICD-10-CM | POA: Insufficient documentation

## 2014-08-08 DIAGNOSIS — Z87891 Personal history of nicotine dependence: Secondary | ICD-10-CM | POA: Diagnosis not present

## 2014-08-08 DIAGNOSIS — E785 Hyperlipidemia, unspecified: Secondary | ICD-10-CM | POA: Diagnosis not present

## 2014-08-08 DIAGNOSIS — E119 Type 2 diabetes mellitus without complications: Secondary | ICD-10-CM | POA: Diagnosis not present

## 2014-08-08 DIAGNOSIS — Z8249 Family history of ischemic heart disease and other diseases of the circulatory system: Secondary | ICD-10-CM | POA: Insufficient documentation

## 2014-08-08 DIAGNOSIS — I6523 Occlusion and stenosis of bilateral carotid arteries: Secondary | ICD-10-CM | POA: Diagnosis not present

## 2014-08-08 DIAGNOSIS — I10 Essential (primary) hypertension: Secondary | ICD-10-CM | POA: Diagnosis not present

## 2014-08-08 MED ORDER — TECHNETIUM TC 99M SESTAMIBI GENERIC - CARDIOLITE
30.0000 | Freq: Once | INTRAVENOUS | Status: AC | PRN
  Administered 2014-08-08: 30 via INTRAVENOUS

## 2014-08-08 MED ORDER — REGADENOSON 0.4 MG/5ML IV SOLN
0.4000 mg | Freq: Once | INTRAVENOUS | Status: AC
  Administered 2014-08-08: 0.4 mg via INTRAVENOUS

## 2014-08-08 MED ORDER — TECHNETIUM TC 99M SESTAMIBI GENERIC - CARDIOLITE
10.0000 | Freq: Once | INTRAVENOUS | Status: AC | PRN
  Administered 2014-08-08: 10 via INTRAVENOUS

## 2014-08-08 MED ORDER — AMINOPHYLLINE 25 MG/ML IV SOLN
75.0000 mg | Freq: Once | INTRAVENOUS | Status: AC
  Administered 2014-08-08: 75 mg via INTRAVENOUS

## 2014-08-08 NOTE — Telephone Encounter (Signed)
Normal nuc results called to patient.  Copy sent to Dr. Lysbeth GalasNyland.

## 2014-08-08 NOTE — Telephone Encounter (Signed)
-----   Message from Rollene RotundaJames Hochrein, MD sent at 08/08/2014  3:35 PM EST ----- Low risk nuclear study.  No further work up.  Call Mr. Alona BeneJoyce with the results and send results to Josue HectorNYLAND,LEONARD Cesario, MD

## 2014-08-08 NOTE — Procedures (Addendum)
[name] [name] [place] [address] [phone]  Cardiology Nuclear Med Study  Juan BottomRobert T Patel is a 80 y.o. male     MRN : [id number]     DOB: 06-21-1926  Procedure Date: 08/08/2014  Nuclear Med Background Indication for Stress Test:  Evaluation for Ischemia and Surgical Clearance History:  CAD;CABG X4-07/1992;No prior NUC MPI for comparison. Cardiac Risk Factors: Carotid Disease, Family History - CAD, History of Smoking, Hypertension, Lipids, NIDDM, PVD and AAA  Symptoms:  Pt denies all symptoms at this time.   Nuclear Pre-Procedure Caffeine/Decaff Intake:  7:00pm NPO After: 5:00am   IV Site: R Forearm  IV 0.9% NS with Angio Cath:  22g  Chest Size (in):  42"  IV Started by: Berdie OgrenAmanda Wease, RN  Height: 6\' 2"  (1.88 m)  Cup Size: n/a  BMI:  Body mass index is 25.03 kg/(m^2). Weight:  195 lb (88.451 kg)   Tech Comments:  n/a    Nuclear Med Study 1 or 2 day study: 1 day  Stress Test Type:  Lexiscan  Order Authorizing Provider:  Rollene RotundaJames Hochrein, MD   Resting Radionuclide: Technetium 5747m Sestamibi  Resting Radionuclide Dose: 10.2 mCi   Stress Radionuclide:  Technetium 7547m Sestamibi  Stress Radionuclide Dose: 31.0 mCi           Stress Protocol Rest HR:48 Stress HR: 65  Rest BP;141/87 Stress BP: 142/84  Exercise Time (min): n/a METS: n/a          Dose of Adenosine (mg):  n/a Dose of Lexiscan: 0.4 mg  Dose of Atropine (mg): n/a Dose of Dobutamine: n/a mcg/kg/min (at max HR)  Stress Test Technologist: Ernestene MentionGwen Farrington, CCT Nuclear Technologist: [name], CNMT   Rest Procedure:  Myocardial perfusion imaging was performed at rest 45 minutes following the intravenous administration of Technetium 1547m Sestamibi. Stress Procedure:  The patient received IV Lexiscan 0.4 mg over 15-seconds.  Technetium 6747m Sestamibi injected  IVat 30-seconds.  Patient experienced shortness of breath and was administered 75 mg of  Aminophylline IV. There were no significant changes with Lexiscan.  Quantitative spect images were obtained after a 45 minute delay.  Transient Ischemic Dilatation (Normal <1.22):  1.30 QGS EDV:  93 ml QGS ESV:  45 ml LV Ejection Fraction: 51%       Rest ECG: NSR - Normal EKG  Stress ECG: No significant change from baseline ECG  QPS Raw Data Images:  Normal; no motion artifact; normal heart/lung ratio. Stress Images:  There is decreased uptake in the inferior wall. Rest Images:  There is decreased uptake in the inferior wall. Subtraction (SDS):  There is a fixed inferior defect that is most consistent with diaphragmatic attenuation.  Impression Exercise Capacity:  Lexiscan with no exercise. BP Response:  Normal blood pressure response. Clinical Symptoms:  No significant symptoms noted. ECG Impression:  No significant ST segment change suggestive of ischemia. Comparison with Prior Nuclear Study: No images to compare  Overall Impression:  Low risk stress nuclear study Diaphragmatic attenuation.  LV Wall Motion:  NL LV Function; NL Wall Motion   Juan GessBERRY,JONATHAN J, MD  08/08/2014 12:27 PM

## 2014-08-11 ENCOUNTER — Other Ambulatory Visit: Payer: Self-pay

## 2014-08-14 ENCOUNTER — Encounter (HOSPITAL_COMMUNITY): Payer: Self-pay

## 2014-08-14 ENCOUNTER — Encounter (HOSPITAL_COMMUNITY)
Admission: RE | Admit: 2014-08-14 | Discharge: 2014-08-14 | Disposition: A | Discharge status: Home / Self Care | Payer: Medicare Other | Source: Ambulatory Visit | Attending: Vascular Surgery | Admitting: Vascular Surgery

## 2014-08-14 ENCOUNTER — Other Ambulatory Visit (HOSPITAL_COMMUNITY): Payer: Medicare Other

## 2014-08-14 ENCOUNTER — Ambulatory Visit: Payer: Medicare Other | Admitting: Family

## 2014-08-14 HISTORY — DX: Major depressive disorder, single episode, unspecified: F32.9

## 2014-08-14 HISTORY — DX: Depression, unspecified: F32.A

## 2014-08-14 HISTORY — DX: Unspecified dementia, unspecified severity, without behavioral disturbance, psychotic disturbance, mood disturbance, and anxiety: F03.90

## 2014-08-14 LAB — COMPREHENSIVE METABOLIC PANEL
ALT: 23 U/L (ref 0–53)
AST: 22 U/L (ref 0–37)
Albumin: 3.6 g/dL (ref 3.5–5.2)
Alkaline Phosphatase: 73 U/L (ref 39–117)
Anion gap: 11 (ref 5–15)
BUN: 15 mg/dL (ref 6–23)
CHLORIDE: 100 meq/L (ref 96–112)
CO2: 29 mEq/L (ref 19–32)
Calcium: 9.6 mg/dL (ref 8.4–10.5)
Creatinine, Ser: 0.89 mg/dL (ref 0.50–1.35)
GFR calc Af Amer: 88 mL/min — ABNORMAL LOW (ref >90)
GFR calc non Af Amer: 76 mL/min — ABNORMAL LOW (ref >90)
Glucose, Bld: 98 mg/dL (ref 70–99)
Potassium: 4.4 mEq/L (ref 3.7–5.3)
Sodium: 140 mEq/L (ref 137–147)
Total Bilirubin: 0.5 mg/dL (ref 0.3–1.2)
Total Protein: 7.5 g/dL (ref 6.0–8.3)

## 2014-08-14 LAB — CBC
HEMATOCRIT: 42.8 % (ref 39.0–52.0)
HEMOGLOBIN: 14.4 g/dL (ref 13.0–17.0)
MCH: 33.3 pg (ref 26.0–34.0)
MCHC: 33.6 g/dL (ref 30.0–36.0)
MCV: 98.8 fL (ref 78.0–100.0)
PLATELETS: 208 10*3/uL (ref 150–400)
RBC: 4.33 MIL/uL (ref 4.22–5.81)
RDW: 12.5 % (ref 11.5–15.5)
WBC: 11 10*3/uL — AB (ref 4.0–10.5)

## 2014-08-14 LAB — URINALYSIS, ROUTINE W REFLEX MICROSCOPIC
BILIRUBIN URINE: NEGATIVE
Glucose, UA: 500 mg/dL — AB
Hgb urine dipstick: NEGATIVE
Ketones, ur: NEGATIVE mg/dL
LEUKOCYTES UA: NEGATIVE
NITRITE: NEGATIVE
Protein, ur: NEGATIVE mg/dL
SPECIFIC GRAVITY, URINE: 1.026 (ref 1.005–1.030)
Urobilinogen, UA: 0.2 mg/dL (ref 0.0–1.0)
pH: 5.5 (ref 5.0–8.0)

## 2014-08-14 LAB — BLOOD GAS, ARTERIAL
ACID-BASE EXCESS: 3.7 mmol/L — AB (ref 0.0–2.0)
BICARBONATE: 27.6 meq/L — AB (ref 20.0–24.0)
Drawn by: 206361
FIO2: 0.21 %
O2 Saturation: 91 %
PO2 ART: 57.4 mmHg — AB (ref 80.0–100.0)
Patient temperature: 98.6
TCO2: 28.8 mmol/L (ref 0–100)
pCO2 arterial: 40.7 mmHg (ref 35.0–45.0)
pH, Arterial: 7.446 (ref 7.350–7.450)

## 2014-08-14 LAB — TYPE AND SCREEN
ABO/RH(D): O POS
Antibody Screen: NEGATIVE

## 2014-08-14 LAB — PROTIME-INR
INR: 0.98 (ref 0.00–1.49)
Prothrombin Time: 13.1 seconds (ref 11.6–15.2)

## 2014-08-14 LAB — SURGICAL PCR SCREEN
MRSA, PCR: NEGATIVE
Staphylococcus aureus: NEGATIVE

## 2014-08-14 LAB — ABO/RH: ABO/RH(D): O POS

## 2014-08-14 LAB — APTT: aPTT: 29 seconds (ref 24–37)

## 2014-08-14 NOTE — Progress Notes (Signed)
Primary - dr. Lysbeth Galasnyland Cardiologist - dr. Antoine Pochehochrein Stress and ekg in epic from this past week

## 2014-08-14 NOTE — Pre-Procedure Instructions (Signed)
Juan BottomRobert T Lobdell  08/14/2014   Your procedure is scheduled on:  Wednesday, November 11th  [facility] at [phone] AM.  Call this number if you have problems the morning of surgery: [phone]   Remember:   Do not eat food or drink liquids after midnight.   Take these medicines the morning of surgery with A SIP OF WATER: lopressor, aricept, aspirin   Do not wear jewelry.  Do not wear lotions, powders, or perfume, deodorant.  Do not shave 48 hours prior to surgery. Men may shave face and neck.  Do not bring valuables to the hospital.  [facility] is not responsible  for any belongings or valuables.               Contacts, dentures or bridgework may not be worn into surgery.  Leave suitcase in the car. After surgery it may be brought to your room.  For patients admitted to the hospital, discharge time is determined by your  treatment team.             Please read over the following fact sheets that you were given: Pain Booklet, Coughing and Deep Breathing, Blood Transfusion Information, MRSA Information and Surgical Site Infection Prevention  [facility] - Preparing for Surgery  Before surgery, you can play an important role.  Because skin is not sterile, your skin needs to be as free of germs as possible.  You can reduce the number of germs on you skin by washing with CHG (chlorahexidine gluconate) soap before surgery.  CHG is an antiseptic cleaner which kills germs and bonds with the skin to continue killing germs even after washing.  Please DO NOT use if you have an allergy to CHG or antibacterial soaps.  If your skin becomes reddened/irritated stop using the CHG and inform your nurse when you arrive at Short Stay.  Do not shave (including legs and underarms) for at least 48 hours prior to the first CHG shower.  You may shave your face.  Please follow these instructions carefully:   1.  Shower with CHG Soap the night before surgery and the morning of  Surgery.  2.  If you choose to wash your hair, wash your hair first as usual with your normal shampoo.  3.  After you shampoo, rinse your hair and body thoroughly to remove the shampoo.  4.  Use CHG as you would any other liquid soap.  You can apply CHG directly to the skin and wash gently with scrungie or a clean washcloth.  5.  Apply the CHG Soap to your body ONLY FROM THE NECK DOWN.  Do not use on open wounds or open sores.  Avoid contact with your eyes, ears, mouth and genitals (private parts).  Wash genitals (private parts) with your normal soap.  6.  Wash thoroughly, paying special attention to the area where your surgery will be performed.  7.  Thoroughly rinse your body with warm water from the neck down.  8.  DO NOT shower/wash with your normal soap after using and rinsing off the CHG Soap.  9.  Pat yourself dry with a clean towel.            10.  Wear clean pajamas.            11.  Place clean sheets on your bed the night of your first shower and do not sleep with pets.  Day of Surgery  Do not apply any  lotions/deoderants the morning of surgery.  Please wear clean clothes to the hospital/surgery center.

## 2014-08-15 MED ORDER — DEXTROSE 5 % IV SOLN
1.5000 g | INTRAVENOUS | Status: AC
  Administered 2014-08-16: 1.5 g via INTRAVENOUS
  Filled 2014-08-15: qty 1.5

## 2014-08-16 ENCOUNTER — Encounter (HOSPITAL_COMMUNITY): Payer: Self-pay | Admitting: *Deleted

## 2014-08-16 ENCOUNTER — Inpatient Hospital Stay (HOSPITAL_COMMUNITY): Payer: Medicare Other | Admitting: Anesthesiology

## 2014-08-16 ENCOUNTER — Inpatient Hospital Stay (HOSPITAL_COMMUNITY)
Admission: RE | Admit: 2014-08-16 | Discharge: 2014-08-17 | DRG: 269 | Disposition: A | Discharge status: Home / Self Care | Payer: Medicare Other | Source: Ambulatory Visit | Attending: Vascular Surgery | Admitting: Vascular Surgery

## 2014-08-16 ENCOUNTER — Inpatient Hospital Stay (HOSPITAL_COMMUNITY): Payer: Medicare Other

## 2014-08-16 ENCOUNTER — Encounter (HOSPITAL_COMMUNITY)
Admission: RE | Disposition: A | Discharge status: Home / Self Care | Payer: Self-pay | Source: Ambulatory Visit | Attending: Vascular Surgery

## 2014-08-16 DIAGNOSIS — Z87891 Personal history of nicotine dependence: Secondary | ICD-10-CM

## 2014-08-16 DIAGNOSIS — E785 Hyperlipidemia, unspecified: Secondary | ICD-10-CM | POA: Diagnosis present

## 2014-08-16 DIAGNOSIS — F329 Major depressive disorder, single episode, unspecified: Secondary | ICD-10-CM | POA: Diagnosis present

## 2014-08-16 DIAGNOSIS — Z951 Presence of aortocoronary bypass graft: Secondary | ICD-10-CM | POA: Diagnosis not present

## 2014-08-16 DIAGNOSIS — Z8249 Family history of ischemic heart disease and other diseases of the circulatory system: Secondary | ICD-10-CM

## 2014-08-16 DIAGNOSIS — F0390 Unspecified dementia without behavioral disturbance: Secondary | ICD-10-CM | POA: Diagnosis present

## 2014-08-16 DIAGNOSIS — I2510 Atherosclerotic heart disease of native coronary artery without angina pectoris: Secondary | ICD-10-CM | POA: Diagnosis present

## 2014-08-16 DIAGNOSIS — E119 Type 2 diabetes mellitus without complications: Secondary | ICD-10-CM | POA: Diagnosis present

## 2014-08-16 DIAGNOSIS — Z79899 Other long term (current) drug therapy: Secondary | ICD-10-CM | POA: Diagnosis not present

## 2014-08-16 DIAGNOSIS — I714 Abdominal aortic aneurysm, without rupture, unspecified: Secondary | ICD-10-CM | POA: Diagnosis present

## 2014-08-16 DIAGNOSIS — Z7982 Long term (current) use of aspirin: Secondary | ICD-10-CM

## 2014-08-16 DIAGNOSIS — Z9889 Other specified postprocedural states: Secondary | ICD-10-CM

## 2014-08-16 DIAGNOSIS — I10 Essential (primary) hypertension: Secondary | ICD-10-CM | POA: Diagnosis present

## 2014-08-16 DIAGNOSIS — Z8679 Personal history of other diseases of the circulatory system: Secondary | ICD-10-CM

## 2014-08-16 HISTORY — PX: ABDOMINAL AORTIC ENDOVASCULAR STENT GRAFT: SHX5707

## 2014-08-16 LAB — CBC
HCT: 35.8 % — ABNORMAL LOW (ref 39.0–52.0)
Hemoglobin: 12.1 g/dL — ABNORMAL LOW (ref 13.0–17.0)
MCH: 33.5 pg (ref 26.0–34.0)
MCHC: 33.8 g/dL (ref 30.0–36.0)
MCV: 99.2 fL (ref 78.0–100.0)
Platelets: 135 10*3/uL — ABNORMAL LOW (ref 150–400)
RBC: 3.61 MIL/uL — ABNORMAL LOW (ref 4.22–5.81)
RDW: 12.4 % (ref 11.5–15.5)
WBC: 11.1 10*3/uL — AB (ref 4.0–10.5)

## 2014-08-16 LAB — GLUCOSE, CAPILLARY
Glucose-Capillary: 137 mg/dL — ABNORMAL HIGH (ref 70–99)
Glucose-Capillary: 125 mg/dL — ABNORMAL HIGH (ref 70–99)
Glucose-Capillary: 162 mg/dL — ABNORMAL HIGH (ref 70–99)

## 2014-08-16 LAB — BASIC METABOLIC PANEL
ANION GAP: 14 (ref 5–15)
BUN: 13 mg/dL (ref 6–23)
CHLORIDE: 101 meq/L (ref 96–112)
CO2: 25 mEq/L (ref 19–32)
Calcium: 8.4 mg/dL (ref 8.4–10.5)
Creatinine, Ser: 0.8 mg/dL (ref 0.50–1.35)
GFR calc non Af Amer: 79 mL/min — ABNORMAL LOW (ref >90)
Glucose, Bld: 131 mg/dL — ABNORMAL HIGH (ref 70–99)
POTASSIUM: 3.6 meq/L — AB (ref 3.7–5.3)
SODIUM: 140 meq/L (ref 137–147)

## 2014-08-16 LAB — APTT: APTT: 31 s (ref 24–37)

## 2014-08-16 LAB — MAGNESIUM: Magnesium: 1.7 mg/dL (ref 1.5–2.5)

## 2014-08-16 LAB — PROTIME-INR
INR: 1.13 (ref 0.00–1.49)
Prothrombin Time: 14.7 seconds (ref 11.6–15.2)

## 2014-08-16 SURGERY — INSERTION, ENDOVASCULAR STENT GRAFT, AORTA, ABDOMINAL
Anesthesia: General

## 2014-08-16 MED ORDER — DONEPEZIL HCL 10 MG PO TABS
10.0000 mg | ORAL_TABLET | Freq: Every morning | ORAL | Status: DC
  Administered 2014-08-17: 10 mg via ORAL
  Filled 2014-08-16: qty 1

## 2014-08-16 MED ORDER — NEOSTIGMINE METHYLSULFATE 10 MG/10ML IV SOLN
INTRAVENOUS | Status: DC | PRN
  Administered 2014-08-16: 4 mg via INTRAVENOUS

## 2014-08-16 MED ORDER — HEPARIN SODIUM (PORCINE) 1000 UNIT/ML IJ SOLN
INTRAMUSCULAR | Status: DC | PRN
  Administered 2014-08-16: 7000 [IU] via INTRAVENOUS

## 2014-08-16 MED ORDER — MAGNESIUM SULFATE 2 GM/50ML IV SOLN: 2.0000 g | Freq: Every day | INTRAVENOUS | Status: DC | PRN

## 2014-08-16 MED ORDER — ACETAMINOPHEN 325 MG PO TABS: 325.0000 mg | ORAL_TABLET | ORAL | Status: DC | PRN

## 2014-08-16 MED ORDER — GUAIFENESIN-DM 100-10 MG/5ML PO SYRP: 15.0000 mL | ORAL_SOLUTION | ORAL | Status: DC | PRN

## 2014-08-16 MED ORDER — CHLORHEXIDINE GLUCONATE 4 % EX LIQD: 60.0000 mL | Freq: Once | CUTANEOUS | Status: DC

## 2014-08-16 MED ORDER — HYDRALAZINE HCL 20 MG/ML IJ SOLN
INTRAMUSCULAR | Status: DC | PRN
  Administered 2014-08-16: 2 mg via INTRAVENOUS

## 2014-08-16 MED ORDER — LACTATED RINGERS IV SOLN
INTRAVENOUS | Status: DC | PRN
  Administered 2014-08-16: 11:00:00 via INTRAVENOUS

## 2014-08-16 MED ORDER — ENOXAPARIN SODIUM 40 MG/0.4ML ~~LOC~~ SOLN
40.0000 mg | SUBCUTANEOUS | Status: DC
  Administered 2014-08-17: 40 mg via SUBCUTANEOUS
  Filled 2014-08-16: qty 0.4

## 2014-08-16 MED ORDER — ESCITALOPRAM OXALATE 20 MG PO TABS
20.0000 mg | ORAL_TABLET | Freq: Every day | ORAL | Status: DC
  Administered 2014-08-16: 20 mg via ORAL
  Filled 2014-08-16 (×2): qty 1

## 2014-08-16 MED ORDER — DOCUSATE SODIUM 100 MG PO CAPS
100.0000 mg | ORAL_CAPSULE | Freq: Every day | ORAL | Status: DC
  Administered 2014-08-17: 100 mg via ORAL
  Filled 2014-08-16: qty 1

## 2014-08-16 MED ORDER — PANTOPRAZOLE SODIUM 40 MG PO TBEC
40.0000 mg | DELAYED_RELEASE_TABLET | Freq: Every day | ORAL | Status: DC
  Administered 2014-08-16 – 2014-08-17 (×2): 40 mg via ORAL
  Filled 2014-08-16 (×2): qty 1

## 2014-08-16 MED ORDER — LABETALOL HCL 5 MG/ML IV SOLN: 10.0000 mg | INTRAVENOUS | Status: DC | PRN

## 2014-08-16 MED ORDER — ALUM & MAG HYDROXIDE-SIMETH 200-200-20 MG/5ML PO SUSP: 15.0000 mL | ORAL | Status: DC | PRN

## 2014-08-16 MED ORDER — DOPAMINE-DEXTROSE 3.2-5 MG/ML-% IV SOLN: 3.0000 ug/kg/min | INTRAVENOUS | Status: DC | PRN

## 2014-08-16 MED ORDER — METOPROLOL TARTRATE 50 MG PO TABS
50.0000 mg | ORAL_TABLET | Freq: Two times a day (BID) | ORAL | Status: DC
  Administered 2014-08-16 – 2014-08-17 (×2): 50 mg via ORAL
  Filled 2014-08-16 (×3): qty 1

## 2014-08-16 MED ORDER — INSULIN ASPART 100 UNIT/ML ~~LOC~~ SOLN
0.0000 [IU] | Freq: Three times a day (TID) | SUBCUTANEOUS | Status: DC
  Administered 2014-08-17 (×2): 2 [IU] via SUBCUTANEOUS

## 2014-08-16 MED ORDER — BISACODYL 10 MG RE SUPP: 10.0000 mg | Freq: Every day | RECTAL | Status: DC | PRN

## 2014-08-16 MED ORDER — ARTIFICIAL TEARS OP OINT
TOPICAL_OINTMENT | OPHTHALMIC | Status: AC
  Filled 2014-08-16: qty 3.5

## 2014-08-16 MED ORDER — ONDANSETRON HCL 4 MG/2ML IJ SOLN
INTRAMUSCULAR | Status: DC | PRN
  Administered 2014-08-16: 4 mg via INTRAVENOUS

## 2014-08-16 MED ORDER — FENTANYL CITRATE 0.05 MG/ML IJ SOLN: 25.0000 ug | INTRAMUSCULAR | Status: DC | PRN

## 2014-08-16 MED ORDER — LACTATED RINGERS IV SOLN
INTRAVENOUS | Status: DC | PRN
Start: 2014-08-16 — End: 2014-08-16
  Administered 2014-08-16: 11:00:00 via INTRAVENOUS

## 2014-08-16 MED ORDER — ROCURONIUM BROMIDE 100 MG/10ML IV SOLN
INTRAVENOUS | Status: DC | PRN
  Administered 2014-08-16: 40 mg via INTRAVENOUS
  Administered 2014-08-16 (×2): 10 mg via INTRAVENOUS

## 2014-08-16 MED ORDER — PROTAMINE SULFATE 10 MG/ML IV SOLN
INTRAVENOUS | Status: AC
  Filled 2014-08-16: qty 10

## 2014-08-16 MED ORDER — HEPARIN SODIUM (PORCINE) 1000 UNIT/ML IJ SOLN
INTRAMUSCULAR | Status: AC
  Filled 2014-08-16: qty 1

## 2014-08-16 MED ORDER — ADULT MULTIVITAMIN W/MINERALS CH
1.0000 | ORAL_TABLET | Freq: Every day | ORAL | Status: DC
  Administered 2014-08-17: 1 via ORAL
  Filled 2014-08-16: qty 1

## 2014-08-16 MED ORDER — HEPARIN SODIUM (PORCINE) 5000 UNIT/ML IJ SOLN
INTRAMUSCULAR | Status: DC | PRN
  Administered 2014-08-16: 500 mL

## 2014-08-16 MED ORDER — GLYCOPYRROLATE 0.2 MG/ML IJ SOLN
INTRAMUSCULAR | Status: AC
  Filled 2014-08-16: qty 1

## 2014-08-16 MED ORDER — IODIXANOL 320 MG/ML IV SOLN
INTRAVENOUS | Status: DC | PRN
  Administered 2014-08-16: 110 mL via INTRA_ARTERIAL

## 2014-08-16 MED ORDER — ACETAMINOPHEN 650 MG RE SUPP: 325.0000 mg | RECTAL | Status: DC | PRN

## 2014-08-16 MED ORDER — GLYCOPYRROLATE 0.2 MG/ML IJ SOLN
INTRAMUSCULAR | Status: DC | PRN
  Administered 2014-08-16: 0.6 mg via INTRAVENOUS
  Administered 2014-08-16: 0.2 mg via INTRAVENOUS

## 2014-08-16 MED ORDER — ONDANSETRON HCL 4 MG/2ML IJ SOLN: 4.0000 mg | Freq: Four times a day (QID) | INTRAMUSCULAR | Status: DC | PRN

## 2014-08-16 MED ORDER — ATORVASTATIN CALCIUM 10 MG PO TABS
10.0000 mg | ORAL_TABLET | Freq: Every day | ORAL | Status: DC
  Administered 2014-08-16: 10 mg via ORAL
  Filled 2014-08-16 (×2): qty 1

## 2014-08-16 MED ORDER — CEFUROXIME SODIUM 1.5 G IJ SOLR
1.5000 g | Freq: Two times a day (BID) | INTRAMUSCULAR | Status: AC
  Administered 2014-08-16 – 2014-08-17 (×2): 1.5 g via INTRAVENOUS
  Filled 2014-08-16 (×2): qty 1.5

## 2014-08-16 MED ORDER — SODIUM CHLORIDE 0.9 % IV SOLN: INTRAVENOUS | Status: DC

## 2014-08-16 MED ORDER — ASPIRIN 81 MG PO CHEW
81.0000 mg | CHEWABLE_TABLET | Freq: Two times a day (BID) | ORAL | Status: DC
  Administered 2014-08-16 – 2014-08-17 (×2): 81 mg via ORAL
  Filled 2014-08-16 (×3): qty 1

## 2014-08-16 MED ORDER — SODIUM CHLORIDE 0.9 % IV SOLN: INTRAVENOUS | Status: DC | PRN

## 2014-08-16 MED ORDER — ROCURONIUM BROMIDE 50 MG/5ML IV SOLN
INTRAVENOUS | Status: AC
  Filled 2014-08-16: qty 1

## 2014-08-16 MED ORDER — MORPHINE SULFATE 2 MG/ML IJ SOLN: 2.0000 mg | INTRAMUSCULAR | Status: DC | PRN

## 2014-08-16 MED ORDER — PROPOFOL 10 MG/ML IV BOLUS
INTRAVENOUS | Status: DC | PRN
  Administered 2014-08-16: 150 mg via INTRAVENOUS

## 2014-08-16 MED ORDER — LABETALOL HCL 5 MG/ML IV SOLN
INTRAVENOUS | Status: DC | PRN
  Administered 2014-08-16 (×2): 5 mg via INTRAVENOUS

## 2014-08-16 MED ORDER — METOPROLOL TARTRATE 1 MG/ML IV SOLN: 2.0000 mg | INTRAVENOUS | Status: DC | PRN

## 2014-08-16 MED ORDER — PROMETHAZINE HCL 25 MG/ML IJ SOLN: 6.2500 mg | INTRAMUSCULAR | Status: DC | PRN

## 2014-08-16 MED ORDER — LACTATED RINGERS IV SOLN
INTRAVENOUS | Status: DC
  Administered 2014-08-16: 10:00:00 via INTRAVENOUS

## 2014-08-16 MED ORDER — 0.9 % SODIUM CHLORIDE (POUR BTL) OPTIME
TOPICAL | Status: DC | PRN
  Administered 2014-08-16: 1000 mL

## 2014-08-16 MED ORDER — SODIUM CHLORIDE 0.9 % IV SOLN
INTRAVENOUS | Status: DC
  Administered 2014-08-16: 18:00:00 via INTRAVENOUS

## 2014-08-16 MED ORDER — PROTAMINE SULFATE 10 MG/ML IV SOLN
INTRAVENOUS | Status: DC | PRN
  Administered 2014-08-16: 50 mg via INTRAVENOUS

## 2014-08-16 MED ORDER — LIDOCAINE HCL (CARDIAC) 20 MG/ML IV SOLN
INTRAVENOUS | Status: DC | PRN
  Administered 2014-08-16: 20 mg via INTRAVENOUS
  Administered 2014-08-16: 60 mg via INTRAVENOUS

## 2014-08-16 MED ORDER — PHENYLEPHRINE HCL 10 MG/ML IJ SOLN
10.0000 mg | INTRAVENOUS | Status: DC | PRN
  Administered 2014-08-16: 25 ug/min via INTRAVENOUS

## 2014-08-16 MED ORDER — OXYCODONE HCL 5 MG PO TABS: 5.0000 mg | ORAL_TABLET | ORAL | Status: DC | PRN

## 2014-08-16 MED ORDER — FENTANYL CITRATE 0.05 MG/ML IJ SOLN
INTRAMUSCULAR | Status: AC
  Filled 2014-08-16: qty 5

## 2014-08-16 MED ORDER — NITROGLYCERIN 0.2 MG/ML ON CALL CATH LAB
INTRAVENOUS | Status: DC | PRN
  Administered 2014-08-16 (×4): 40 ug via INTRAVENOUS
  Administered 2014-08-16: 20 ug via INTRAVENOUS

## 2014-08-16 MED ORDER — PHENOL 1.4 % MT LIQD: 1.0000 | OROMUCOSAL | Status: DC | PRN

## 2014-08-16 MED ORDER — MEMANTINE HCL ER 28 MG PO CP24
28.0000 mg | ORAL_CAPSULE | Freq: Every morning | ORAL | Status: DC
  Administered 2014-08-17: 28 mg via ORAL
  Filled 2014-08-16: qty 28

## 2014-08-16 MED ORDER — EPHEDRINE SULFATE 50 MG/ML IJ SOLN
INTRAMUSCULAR | Status: DC | PRN
  Administered 2014-08-16: 10 mg via INTRAVENOUS
  Administered 2014-08-16: 5 mg via INTRAVENOUS

## 2014-08-16 MED ORDER — FENTANYL CITRATE 0.05 MG/ML IJ SOLN
INTRAMUSCULAR | Status: DC | PRN
  Administered 2014-08-16: 100 ug via INTRAVENOUS

## 2014-08-16 MED ORDER — HYDRALAZINE HCL 20 MG/ML IJ SOLN: 5.0000 mg | INTRAMUSCULAR | Status: DC | PRN

## 2014-08-16 MED ORDER — PROPOFOL 10 MG/ML IV BOLUS
INTRAVENOUS | Status: AC
  Filled 2014-08-16: qty 20

## 2014-08-16 MED ORDER — POTASSIUM CHLORIDE CRYS ER 20 MEQ PO TBCR: 20.0000 meq | EXTENDED_RELEASE_TABLET | Freq: Every day | ORAL | Status: DC | PRN

## 2014-08-16 MED ORDER — SODIUM CHLORIDE 0.9 % IV SOLN: 500.0000 mL | Freq: Once | INTRAVENOUS | Status: AC | PRN

## 2014-08-16 SURGICAL SUPPLY — 73 items
APL SKNCLS STERI-STRIP NONHPOA (GAUZE/BANDAGES/DRESSINGS) ×1
BAG BANDED W/RUBBER/TAPE 36X54 (MISCELLANEOUS) ×3 IMPLANT
BAG EQP BAND 135X91 W/RBR TAPE (MISCELLANEOUS) ×1
BAG SNAP BAND KOVER 36X36 (MISCELLANEOUS) ×3 IMPLANT
BENZOIN TINCTURE PRP APPL 2/3 (GAUZE/BANDAGES/DRESSINGS) ×3 IMPLANT
CANISTER SUCTION 2500CC (MISCELLANEOUS) ×3 IMPLANT
CATH BEACON 5.038 65CM KMP-01 (CATHETERS) IMPLANT
CATH OMNI FLUSH .035X70CM (CATHETERS) ×2 IMPLANT
CLIP LIGATING EXTRA MED SLVR (CLIP) IMPLANT
CLIP LIGATING EXTRA SM BLUE (MISCELLANEOUS) IMPLANT
CLOSURE STERI-STRIP 1/2X4 (GAUZE/BANDAGES/DRESSINGS) ×1
CLOSURE WOUND 1/2 X4 (GAUZE/BANDAGES/DRESSINGS) ×1
CLSR STERI-STRIP ANTIMIC 1/2X4 (GAUZE/BANDAGES/DRESSINGS) ×1 IMPLANT
COVER DOME SNAP 22 D (MISCELLANEOUS) ×5 IMPLANT
COVER MAYO STAND STRL (DRAPES) ×3 IMPLANT
COVER PROBE W GEL 5X96 (DRAPES) ×3 IMPLANT
COVER SURGICAL LIGHT HANDLE (MISCELLANEOUS) ×1 IMPLANT
DEVICE CLOSURE PERCLS PRGLD 6F (VASCULAR PRODUCTS) IMPLANT
DRAPE TABLE COVER HEAVY DUTY (DRAPES) ×3 IMPLANT
DRSG TEGADERM 2-3/8X2-3/4 SM (GAUZE/BANDAGES/DRESSINGS) ×4 IMPLANT
ELECT CAUTERY BLADE 6.4 (BLADE) IMPLANT
ELECT REM PT RETURN 9FT ADLT (ELECTROSURGICAL) ×6
ELECTRODE REM PT RTRN 9FT ADLT (ELECTROSURGICAL) ×2 IMPLANT
EXCLUDER TNK LEG 31MX14X13 (Endovascular Graft) IMPLANT
EXCLUDER TRUNK LEG 31MX14X13 (Endovascular Graft) ×3 IMPLANT
GAUZE SPONGE 2X2 8PLY STRL LF (GAUZE/BANDAGES/DRESSINGS) ×1 IMPLANT
GLOVE BIO SURGEON STRL SZ 6.5 (GLOVE) ×2 IMPLANT
GLOVE BIO SURGEONS STRL SZ 6.5 (GLOVE) ×2
GLOVE BIOGEL PI IND STRL 6.5 (GLOVE) IMPLANT
GLOVE BIOGEL PI INDICATOR 6.5 (GLOVE) ×14
GLOVE SS BIOGEL STRL SZ 7.5 (GLOVE) ×1 IMPLANT
GLOVE SUPERSENSE BIOGEL SZ 7.5 (GLOVE) ×2
GOWN STRL REUS W/ TWL LRG LVL3 (GOWN DISPOSABLE) ×3 IMPLANT
GOWN STRL REUS W/TWL LRG LVL3 (GOWN DISPOSABLE) ×12
GRAFT BALLN CATH 65CM (STENTS) IMPLANT
KIT BASIN OR (CUSTOM PROCEDURE TRAY) ×3 IMPLANT
KIT ROOM TURNOVER OR (KITS) ×3 IMPLANT
LEG CONTRALATERAL 16X18X13.5 (Endovascular Graft) ×2 IMPLANT
LEG CONTRALATERAL 27X10 (Vascular Products) ×2 IMPLANT
NDL PERC 18GX7CM (NEEDLE) ×1 IMPLANT
NEEDLE PERC 18GX7CM (NEEDLE) ×3 IMPLANT
NS IRRIG 1000ML POUR BTL (IV SOLUTION) ×3 IMPLANT
PACK AORTA (CUSTOM PROCEDURE TRAY) ×3 IMPLANT
PAD ARMBOARD 7.5X6 YLW CONV (MISCELLANEOUS) ×6 IMPLANT
PENCIL BUTTON HOLSTER BLD 10FT (ELECTRODE) IMPLANT
PERCLOSE PROGLIDE 6F (VASCULAR PRODUCTS) ×12
PROTECTION STATION PRESSURIZED (MISCELLANEOUS) ×3
SHEATH AVANTI 11CM 8FR (MISCELLANEOUS) ×2 IMPLANT
SHEATH BRITE TIP 8FR 23CM (MISCELLANEOUS) ×4 IMPLANT
SPONGE GAUZE 2X2 STER 10/PKG (GAUZE/BANDAGES/DRESSINGS) ×2
STAPLER VISISTAT 35W (STAPLE) IMPLANT
STATION PROTECTION PRESSURIZED (MISCELLANEOUS) ×1 IMPLANT
STENT GRAFT BALLN CATH 65CM (STENTS) ×2
STOPCOCK MORSE 400PSI 3WAY (MISCELLANEOUS) ×3 IMPLANT
STRIP CLOSURE SKIN 1/2X4 (GAUZE/BANDAGES/DRESSINGS) ×2 IMPLANT
SUT ETHILON 3 0 PS 1 (SUTURE) IMPLANT
SUT PROLENE 5 0 C 1 24 (SUTURE) IMPLANT
SUT VIC AB 2-0 CTX 36 (SUTURE) IMPLANT
SUT VIC AB 3-0 SH 18 (SUTURE) IMPLANT
SUT VIC AB 3-0 SH 27 (SUTURE)
SUT VIC AB 3-0 SH 27X BRD (SUTURE) IMPLANT
SUT VICRYL 4-0 PS2 18IN ABS (SUTURE) ×6 IMPLANT
SYR 20CC LL (SYRINGE) ×6 IMPLANT
SYR 30ML LL (SYRINGE) IMPLANT
SYR 5ML LL (SYRINGE) ×3 IMPLANT
SYR MEDRAD MARK V 150ML (SYRINGE) ×3 IMPLANT
SYRINGE 10CC LL (SYRINGE) ×9 IMPLANT
TOWEL OR 17X24 6PK STRL BLUE (TOWEL DISPOSABLE) ×6 IMPLANT
TOWEL OR 17X26 10 PK STRL BLUE (TOWEL DISPOSABLE) ×6 IMPLANT
TRAY FOLEY CATH 16FRSI W/METER (SET/KITS/TRAYS/PACK) ×3 IMPLANT
TUBING HIGH PRESSURE 120CM (CONNECTOR) ×3 IMPLANT
WIRE AMPLATZ SS-J .035X180CM (WIRE) ×4 IMPLANT
WIRE BENTSON .035X145CM (WIRE) ×4 IMPLANT

## 2014-08-16 NOTE — Op Note (Signed)
OPERATIVE REPORT  DATE OF SURGERY: 08/16/2014  PATIENT: Juan BottomRobert T Coull, 80 y.o. male MRN: [id number]  DOB: [birth year]  PRE-OPERATIVE DIAGNOSIS: abdominal aortic aneurysm  POST-OPERATIVE DIAGNOSIS:  Same  PROCEDURE: Emeline DarlingGore excluder stent graft repair of abdominal aortic aneurysm  SURGEON:  Gretta Beganodd Early, M.D.  PHYSICIAN ASSISTANT: Trinh  ANESTHESIA:  Gen.  EBL: minimal ml  Total I/O In: -  Out: 295 [Urine:295]  BLOOD ADMINISTERED: none DRAINS:none  SPECIMEN: none  COUNTS CORRECT:  YES  PLAN OF CARE: PACU stable   PATIENT DISPOSITION:  PACU - hemodynamically stable  PROCEDURE DETAILS: Patient was taken to the operating placed supine position where the area of abdomen both groins prepped and draped in sterile fashion. Using SonoSite ultrasound and a singlewall puncture with an 18-gauge needle, the femoral arteries were accessed bilaterally and a Bentson wire was passed centrally. A 2 Perclose technique was used to place a partially deployed Perclose at the 10:00 and 2:00 position the common femoral arteries bilaterally. 8 French sheath were passed over this. A Kumpe catheter was used to exchange the Bentson wire for an Amplatz superstiff wire bilaterally. The patient was given 6000 intravenous heparin. A 12 French sheath was then exchanged on the right groin and an 6318 French sheath was exchanged on the left groin. The main body of the device which was a 31 x 14 x 13 device was positioned through the left groin. It was placed in a cross legged position. A marker pigtail catheter was positioned on the right limb above the level of the renal arteries which were at the top of L2. Injection revealed level the renal arteries and the main trunk of the main body was deployed. Repeat film showed that this was somewhat lower than the renal artery takeoff and therefore the main body was redemonstrated position several millimeters proximally. This was then redeployed and repeat injection  revealed excellent positioning. The Amplatz wire was left in place and the 12 French contralateral limb limb sheath was withdrawn down to the aortic sac. Using a buddy wire technique Bentson wire was passed up through this and the contralateral gate was easily cannulated. The pigtail catheter was positioned over the Bentson wire was rolled in the main body to confirm that this was indeed in the stent graft. I retrograde injection to the right sheath revealed the appropriate length of the contralateral limb. Contralateral limb was an 18 x 13.5 device and this was positioned through the 12 French sheath was landed just above the hypogastric artery takeoff. Finally the main body was completely deployed. The extension limb on the left was a 27 x 10 extension. Retrograde injection through the left sheath revealed the level of hypogastric artery takeoff. A 27 x 10 extension was positioned just above the hypogastric takeoff was deployed. The proximal and distal attachment sites and all junctions were gently dilated with a 250 balloon. Final injection was taken by placing a pigtail catheter again above the level renal arteries. This revealed excellent positioning with no evidence of endoleak. The Amplatz wires were exchanged for a Bentson wires. The sheaths were removed and the Perclose devices were deployed giving excellent hemostasis. The wires were pulled in the groins were closed with a single 4 septic or Vicryl suture and each puncture site. Patient was given 50 mg of protamine to reverse the heparin. The patient had normal 2+ dorsalis pedis pulses. Transfer to the recovery room in stable condition   Gretta Beganodd Early, M.D. 08/16/2014 1:49 PM

## 2014-08-16 NOTE — Anesthesia Postprocedure Evaluation (Signed)
  Anesthesia Post-op Note  Patient: Eliezer BottomRobert T Crosby  Procedure(s) Performed: Procedure(s): ABDOMINAL AORTIC ENDOVASCULAR STENT GRAFT (N/A)  Patient Location: PACU  Anesthesia Type:General  Level of Consciousness: awake, alert  and oriented  Airway and Oxygen Therapy: Patient Spontanous Breathing  Post-op Pain: none  Post-op Assessment: Post-op Vital signs reviewed  Post-op Vital Signs: Reviewed  Last Vitals:  Filed Vitals:   08/16/14 1530  BP:   Pulse: 56  Temp:   Resp: 27    Complications: No apparent anesthesia complications

## 2014-08-16 NOTE — H&P (View-Only) (Signed)
Patient name: Juan Patel MRN: [id number] DOB: October 21, 1925 Sex: male   Referred by: Lysbeth GalasNyland  Reason for referral:  Chief Complaint  Patient presents with  . AAA    6 month f/u  CT 07/28/14    HISTORY OF PRESENT ILLNESS: The patient presents today for further discussion of his infrarenal abdominal aortic aneurysm. He has been followed in our office with serial studies. This has shown some continued progression in size. Most recently he underwent a duplex in May of this year showing a maximal diameter 5.0 cm. He recently underwent a life line screening showing the size of 5.8 cm. He underwent a CAT scan for further evaluation and we are here today for discussion of this. He is quite active for his age of 80. He is here today with his wife. He reports no major medical difficulties. He does have stable coronary disease. His aneurysm  Past Medical History  Diagnosis Date  . CAD (coronary artery disease)     S/P CABG   . Dyslipidemia   . Abdominal aortic aneurysm   . Diabetes mellitus   . HTN (hypertension) 07/04/2011    Past Surgical History  Procedure Laterality Date  . Appendectomy    . Coronary artery bypass graft  Oct. 1993    RIMA to the RCA, LIMA to the LAD, SVG to diagonal, and SVG to circumflex    History   Social History  . Marital Status: Married    Spouse Name: N/A    Number of Children: N/A  . Years of Education: N/A   Occupational History  . Not on file.   Social History Main Topics  . Smoking status: Former Smoker -- 0.50 packs/day    Types: Cigarettes    Quit date: 10/26/1961  . Smokeless tobacco: Never Used  . Alcohol Use: Yes     Comment: rarely  . Drug Use: No  . Sexual Activity: Not on file   Other Topics Concern  . Not on file   Social History Narrative  . No narrative on file    Family History  Problem Relation Age of Onset  . Heart disease Father     Heart Disease before age 78  . Deep vein thrombosis Father     Varicose Veins    . Cancer Sister   . Heart disease Sister   . Hypertension Sister   . Cancer Brother   . Hypertension Daughter   . Deep vein thrombosis Mother     Varicose Veins  . Heart attack Mother   . Hypertension Sister     Allergies as of 08/01/2014  . (No Known Allergies)    Current Outpatient Prescriptions on File Prior to Visit  Medication Sig Dispense Refill  . aspirin 81 MG tablet Take 81 mg by mouth 2 (two) times daily.        Marland Kitchen. atorvastatin (LIPITOR) 10 MG tablet Take 10 mg by mouth daily.        Marland Kitchen. donepezil (ARICEPT) 10 MG tablet Take 1 tablet (10 mg total) by mouth at bedtime.  90 tablet  3  . escitalopram (LEXAPRO) 20 MG tablet Take 20 mg by mouth daily.        . memantine (NAMENDA TITRATION PACK) tablet pack Take 28 mg by mouth See admin instructions. Take as directed.  One Tab daily      . METFORMIN HCL PO Take 500 mg by mouth daily.       . Multiple Vitamin (  MULTIVITAMIN) tablet Take 1 tablet by mouth daily.         No current facility-administered medications on file prior to visit.        PHYSICAL EXAMINATION:  General: The patient is a well-nourished male, in no acute distress. Vital signs are BP 143/68  Pulse 53  Resp 18  Ht 6\' 2"  (1.88 m)  Wt 194 lb (87.998 kg)  BMI 24.90 kg/m2 Pulmonary: There is a good air exchange  Abdomen: Soft and non-tender with no palpable aneurysm Musculoskeletal: There are no major deformities.  There is no significant extremity pain. Neurologic: No focal weakness or paresthesias are detected, Skin: There are no ulcer or rashes noted. Psychiatric: The patient has normal affect. Cardiovascular: Palpable radial femoral and popliteal pulses bilaterally  CT scan was reviewed with the patient. This does show a 5.7 cm infrarenal abdominal aortic aneurysm. He does have an adequate infrarenal neck for stent graft repair. He does have some ectasia of his iliac arteries bilaterally.  Impression and Plan:  5.7 cm asymptomatic infrarenal  abdominal aortic aneurysm. Very long discussion with the patient and his wife. I did explain I feel that he is a candidate for stent graft repair. I have recommended elective repair due to his significant gross and large aneurysm. I explained the expected one night hospitalization if we are able to achieve stent graft repair. We will have him follow-up with Dr. Antoine PocheHochrein cardiac clearance prior to surgery.    EARLY, [name] [facility] [place] Office: [phone]       \

## 2014-08-16 NOTE — Interval H&P Note (Signed)
History and Physical Interval Note:  08/16/2014 10:47 AM  Juan Patel  has presented today for surgery, with the diagnosis of Abdominal Aortic Aneurysm I71.4  The various methods of treatment have been discussed with the patient and family. After consideration of risks, benefits and other options for treatment, the patient has consented to  Procedure(s): ABDOMINAL AORTIC ENDOVASCULAR STENT GRAFT (N/A) as a surgical intervention .  The patient's history has been reviewed, patient examined, no change in status, stable for surgery.  I have reviewed the patient's chart and labs.  Questions were answered to the patient's satisfaction.     EARLY, [name]

## 2014-08-16 NOTE — Plan of Care (Signed)
Problem: Phase I Progression Outcomes Goal: Vascular site scale level 0 - I Vascular Site Scale Level 0: No bruising/bleeding/hematoma Level I (Mild): Bruising/Ecchymosis, minimal bleeding/ooozing, palpable hematoma < 3 cm Level II (Moderate): Bleeding not affecting hemodynamic parameters, pseudoaneurysm, palpable hematoma > 3 cm Level III (Severe) Bleeding which affects hemodynamic parameters or retroperitoneal hemorrhage  Outcome: Completed/Met Date Met:  08/16/14

## 2014-08-16 NOTE — Anesthesia Procedure Notes (Signed)
Procedure Name: Intubation Date/Time: 08/16/2014 11:33 AM Performed by: Adonis HousekeeperNGELL, Shakiya Mcneary M Pre-anesthesia Checklist: Patient identified, Emergency Drugs available, Suction available, Patient being monitored and Timeout performed Patient Re-evaluated:Patient Re-evaluated prior to inductionOxygen Delivery Method: Circle system utilized Preoxygenation: Pre-oxygenation with 100% oxygen Intubation Type: IV induction Ventilation: Mask ventilation without difficulty Laryngoscope Size: Miller and 3 Grade View: Grade I Tube type: Oral Tube size: 8.0 mm Number of attempts: 1 Airway Equipment and Method: Stylet Placement Confirmation: ETT inserted through vocal cords under direct vision,  positive ETCO2 and breath sounds checked- equal and bilateral Secured at: 23 cm Tube secured with: Tape Dental Injury: Teeth and Oropharynx as per pre-operative assessment

## 2014-08-16 NOTE — Anesthesia Preprocedure Evaluation (Addendum)
Anesthesia Evaluation  Patient identified by MRN, date of birth, ID band Patient awake    Reviewed: Allergy & Precautions, H&P , NPO status , Patient's Chart, lab work & pertinent test results, reviewed documented beta blocker date and time   Airway Mallampati: I  TM Distance: >3 FB Neck ROM: Full    Dental  (+) Teeth Intact, Dental Advisory Given   Pulmonary former smoker,  breath sounds clear to auscultation        Cardiovascular hypertension, + CAD, + CABG and + Peripheral Vascular Disease Rhythm:Regular Rate:Normal     Neuro/Psych Depression negative neurological ROS     GI/Hepatic negative GI ROS, Neg liver ROS,   Endo/Other  diabetes, Type 2, Oral Hypoglycemic Agents  Renal/GU negative Renal ROS     Musculoskeletal negative musculoskeletal ROS (+)   Abdominal   Peds  Hematology negative hematology ROS (+)   Anesthesia Other Findings   Reproductive/Obstetrics                            Anesthesia Physical Anesthesia Plan  ASA: III  Anesthesia Plan: General   Post-op Pain Management:    Induction: Intravenous  Airway Management Planned: Oral ETT  Additional Equipment: Arterial line  Intra-op Plan:   Post-operative Plan: Extubation in OR  Informed Consent: I have reviewed the patients History and Physical, chart, labs and discussed the procedure including the risks, benefits and alternatives for the proposed anesthesia with the patient or authorized representative who has indicated his/her understanding and acceptance.   Dental advisory given  Plan Discussed with: CRNA and Surgeon  Anesthesia Plan Comments:         Anesthesia Quick Evaluation

## 2014-08-16 NOTE — Transfer of Care (Signed)
Immediate Anesthesia Transfer of Care Note  Patient: Juan BottomRobert T Patel  Procedure(s) Performed: Procedure(s): ABDOMINAL AORTIC ENDOVASCULAR STENT GRAFT (N/A)  Patient Location: PACU  Anesthesia Type:General  Level of Consciousness: awake and alert   Airway & Oxygen Therapy: Patient Spontanous Breathing  Post-op Assessment: Report given to PACU RN  Post vital signs: Reviewed  Complications: No apparent anesthesia complications

## 2014-08-16 NOTE — Progress Notes (Addendum)
  [facility] Progress Note  08/16/2014 5:13 PM Day of Surgery  Subjective:  Doing well. Denies any pain.    Filed Vitals:   08/16/14 1630  BP:   Pulse: 50  Temp:   Resp: 17    Physical Exam: General: pleasant male in NAD Incisions:  Bilateral groins soft without hematoma.  Extremities:  2+ DP pulses bilaterally.   CBC    Component Value Date/Time   WBC 11.1* 08/16/2014 1408   RBC 3.61* 08/16/2014 1408   HGB 12.1* 08/16/2014 1408   HCT 35.8* 08/16/2014 1408   PLT 135* 08/16/2014 1408   MCV 99.2 08/16/2014 1408   MCH 33.5 08/16/2014 1408   MCHC 33.8 08/16/2014 1408   RDW 12.4 08/16/2014 1408   LYMPHSABS 2.6 06/17/2007 1601   MONOABS 0.9* 06/17/2007 1601   EOSABS 0.2 06/17/2007 1601   BASOSABS 0.0 06/17/2007 1601    BMET    Component Value Date/Time   NA 140 08/16/2014 1408   K 3.6* 08/16/2014 1408   CL 101 08/16/2014 1408   CO2 25 08/16/2014 1408   GLUCOSE 131* 08/16/2014 1408   BUN 13 08/16/2014 1408   CREATININE 0.80 08/16/2014 1408   CREATININE 0.94 08/17/2012 1016   CALCIUM 8.4 08/16/2014 1408   GFRNONAA 79* 08/16/2014 1408   GFRAA >90 08/16/2014 1408    INR    Component Value Date/Time   INR 1.13 08/16/2014 1408     Intake/Output Summary (Last 24 hours) at 08/16/14 1713 Last data filed at 08/16/14 1630  Gross per 24 hour  Intake   1200 ml  Output    605 ml  Net    595 ml     Assessment:  80 y.o. male is s/p:  EVAR Day of Surgery  Plan: -Groins without hematoma. 2+ DP pulses bilaterally -Advance diet as tolerated. -OOB later today. -D/C foley tomorrow.  -DVT prophylaxis:  Lovenox.    Maris BergerKimberly Thersa Mohiuddin, PA-C [facility] Office: [phone] Pager: [phone] 08/16/2014 5:13 PM

## 2014-08-17 ENCOUNTER — Other Ambulatory Visit: Payer: Self-pay | Admitting: *Deleted

## 2014-08-17 ENCOUNTER — Encounter (HOSPITAL_COMMUNITY): Payer: Self-pay | Admitting: Vascular Surgery

## 2014-08-17 DIAGNOSIS — I714 Abdominal aortic aneurysm, without rupture, unspecified: Secondary | ICD-10-CM

## 2014-08-17 DIAGNOSIS — Z95828 Presence of other vascular implants and grafts: Secondary | ICD-10-CM

## 2014-08-17 LAB — BASIC METABOLIC PANEL
Anion gap: 12 (ref 5–15)
BUN: 10 mg/dL (ref 6–23)
CHLORIDE: 100 meq/L (ref 96–112)
CO2: 25 mEq/L (ref 19–32)
Calcium: 8.3 mg/dL — ABNORMAL LOW (ref 8.4–10.5)
Creatinine, Ser: 0.83 mg/dL (ref 0.50–1.35)
GFR calc Af Amer: >90 mL/min (ref >90)
GFR calc non Af Amer: 78 mL/min — ABNORMAL LOW (ref >90)
GLUCOSE: 126 mg/dL — AB (ref 70–99)
Potassium: 4.3 mEq/L (ref 3.7–5.3)
Sodium: 137 mEq/L (ref 137–147)

## 2014-08-17 LAB — CBC
HEMATOCRIT: 35.3 % — AB (ref 39.0–52.0)
HEMOGLOBIN: 12.2 g/dL — AB (ref 13.0–17.0)
MCH: 33.6 pg (ref 26.0–34.0)
MCHC: 34.6 g/dL (ref 30.0–36.0)
MCV: 97.2 fL (ref 78.0–100.0)
Platelets: 148 10*3/uL — ABNORMAL LOW (ref 150–400)
RBC: 3.63 MIL/uL — ABNORMAL LOW (ref 4.22–5.81)
RDW: 12.5 % (ref 11.5–15.5)
WBC: 12.2 10*3/uL — AB (ref 4.0–10.5)

## 2014-08-17 LAB — GLUCOSE, CAPILLARY
GLUCOSE-CAPILLARY: 174 mg/dL — AB (ref 70–99)
Glucose-Capillary: 184 mg/dL — ABNORMAL HIGH (ref 70–99)

## 2014-08-17 MED ORDER — OXYCODONE HCL 5 MG PO TABS: 5.0000 mg | ORAL_TABLET | Freq: Four times a day (QID) | ORAL | Status: DC | PRN

## 2014-08-17 MED ORDER — METFORMIN HCL 500 MG PO TABS: 500.0000 mg | ORAL_TABLET | Freq: Every evening | ORAL | Status: DC

## 2014-08-17 NOTE — Progress Notes (Signed)
Utilization review completed.  

## 2014-08-17 NOTE — Plan of Care (Signed)
Problem: Phase I Progression Outcomes Goal: Neurologically stable/at baseline (CEA/CES) Outcome: Completed/Met Date Met:  08/17/14

## 2014-08-17 NOTE — Progress Notes (Signed)
  [facility] Progress Note  08/17/2014 8:02 AM 1 Day Post-Op  Subjective:  Doing well this am.   Tmax 98.5 BP sys 100s-150s 02 96% 2L; mid 80s-RA  Filed Vitals:   08/17/14 0749  BP: 118/56  Pulse: 58  Temp: 98.5 F (36.9 C)  Resp: 16    Physical Exam: General: sitting up in chair in NAD Incisions:  Bilateral groins soft without hematoma Extremities:  2+ dorsalis pedis pulses bilaterally Abdomen: soft, non tender.   CBC    Component Value Date/Time   WBC 12.2* 08/17/2014 0240   RBC 3.63* 08/17/2014 0240   HGB 12.2* 08/17/2014 0240   HCT 35.3* 08/17/2014 0240   PLT 148* 08/17/2014 0240   MCV 97.2 08/17/2014 0240   MCH 33.6 08/17/2014 0240   MCHC 34.6 08/17/2014 0240   RDW 12.5 08/17/2014 0240   LYMPHSABS 2.6 06/17/2007 1601   MONOABS 0.9* 06/17/2007 1601   EOSABS 0.2 06/17/2007 1601   BASOSABS 0.0 06/17/2007 1601    BMET    Component Value Date/Time   NA 137 08/17/2014 0240   K 4.3 08/17/2014 0240   CL 100 08/17/2014 0240   CO2 25 08/17/2014 0240   GLUCOSE 126* 08/17/2014 0240   BUN 10 08/17/2014 0240   CREATININE 0.83 08/17/2014 0240   CREATININE 0.94 08/17/2012 1016   CALCIUM 8.3* 08/17/2014 0240   GFRNONAA 78* 08/17/2014 0240   GFRAA >90 08/17/2014 0240    INR    Component Value Date/Time   INR 1.13 08/16/2014 1408     Intake/Output Summary (Last 24 hours) at 08/17/14 0802 Last data filed at 08/17/14 0657  Gross per 24 hour  Intake 2201.25 ml  Output   1205 ml  Net 996.25 ml     Assessment:  80 y.o. male is s/p: EVAR 1 Day Post-Op  Plan: -Doing well post-operatively; DP pulses bilaterally -02 sats in mid 80s on RA. Not on home oxygen. Ambulate patient and wean off 02. He may require temporary home 02.  -Needs to void -Dispo: discharge patient after 02 wean, ambulation and voiding. Follow up in 4 months with CTA.    Maris BergerKimberly Trinh, PA-C [facility] Office: [phone] Pager:  [phone] 08/17/2014 8:02 AM

## 2014-08-17 NOTE — Progress Notes (Signed)
sats this am during walk were 88% RA, pt tolerated walk. Pt voided at 1325, 200cc clear, yellow urine. Pt has tolerated solid food diet today. sats have fluctuated occasionally to 89%, mostly 90-94% at rest.  Pt just ambulated in hallway again, poor wave form during walk, but sats maintained 90-93% once wave form picked up, denied SOB during walk, tolerated well. Pt ready to go home he states, wife at bedside. Spoke with Selena BattenKim, PA as walk started, paged after walk to give update.

## 2014-08-17 NOTE — Progress Notes (Signed)
D/c instructions reviewed with pt and his wife. Copy of instructions given to pt/wife. Pt to d/c via wheelchair once dressed and ready.

## 2014-08-17 NOTE — Plan of Care (Signed)
Problem: Phase I Progression Outcomes Goal: Peripheral pulses at baseline (AAA) Outcome: Completed/Met Date Met:  08/17/14 Goal: Pain controlled with appropriate interventions Outcome: Completed/Met Date Met:  08/17/14 Goal: Activity progression as ordered Outcome: Completed/Met Date Met:  08/17/14 Goal: Voiding after catheter removal Outcome: Completed/Met Date Met:  08/17/14 Goal: Initial discharge plan identified Outcome: Completed/Met Date Met:  08/17/14

## 2014-08-18 NOTE — Care Management Note (Signed)
    Page 1 of 1   08/18/2014     12:18:33 PM CARE MANAGEMENT NOTE 08/18/2014  Patient:  Juan Patel,Juan Patel   Account Number:  [id number]  Date Initiated:  08/18/2014  Documentation initiated by:  Donn PieriniWEBSTER,Lavere Stork  Subjective/Objective Assessment:   Pt admitted s/p AAA repair     Action/Plan:   PTA pt lived at home   Anticipated DC Date:  08/17/2014   Anticipated DC Plan:  HOME/SELF CARE         Choice offered to / List presented to:             Status of service:  Completed, signed off Medicare Important Message given?  NA - LOS <3 / Initial given by admissions (If response is "NO", the following Medicare IM given date fields will be blank) Date Medicare IM given:   Medicare IM given by:   Date Additional Medicare IM given:   Additional Medicare IM given by:    Discharge Disposition:  HOME/SELF CARE  Per UR Regulation:  Reviewed for med. necessity/level of care/duration of stay  If discussed at Long Length of Stay Meetings, dates discussed:    Comments:

## 2014-08-18 NOTE — Discharge Summary (Signed)
[facility] Discharge Summary  Juan BottomRobert T Patel 12/11/1925 80 y.o. male  [id number]  Admission Date: 08/16/2014  Discharge Date: 08/17/2014  Physician: Gretta Beganodd Early, MD  Admission Diagnosis: Abdominal Aortic Aneurysm I71 4   HPI:   This is a 80 y.o. male who has been followed in our office with serial studies of his AAA. This has shown some continued progression in size. Most recently he underwent a duplex in May of this year showing a maximal diameter 5.0 cm. He recently underwent a life line screening showing the size of 5.8 cm. He underwent a CAT scan for further evaluation and we are here today for discussion of this. He is quite active for his age of 80. He is here today with his wife. He reports no major medical difficulties. He does have stable coronary disease.   Hospital Course:  The patient was admitted to the hospital and taken to the operating room on 08/16/2014 and underwent: Gore excluder stent graft repair of abdominal aortic aneurysm   The patient tolerated the procedure well and was transported to the PACU in stable condition.   By POD 1, he was doing well. He groins were soft without hematoma and he had palpable dorsalis pedis pulses bilaterally. Overnight, he did have oxygen saturation rates in the mid 80s on room air but was asymptomatic. He was weaned off his oxygen with saturation rates in the low to mid 90s at rest. He was able to ambulate and void without difficulty. He was discharged home on POD 1 in good condition.    CBC    Component Value Date/Time   WBC 12.2* 08/17/2014 0240   RBC 3.63* 08/17/2014 0240   HGB 12.2* 08/17/2014 0240   HCT 35.3* 08/17/2014 0240   PLT 148* 08/17/2014 0240   MCV 97.2 08/17/2014 0240   MCH 33.6 08/17/2014 0240   MCHC 34.6 08/17/2014 0240   RDW 12.5 08/17/2014 0240   LYMPHSABS 2.6 06/17/2007 1601   MONOABS 0.9* 06/17/2007 1601   EOSABS 0.2 06/17/2007 1601   BASOSABS 0.0 06/17/2007 1601     BMET    Component Value Date/Time   NA 137 08/17/2014 0240   K 4.3 08/17/2014 0240   CL 100 08/17/2014 0240   CO2 25 08/17/2014 0240   GLUCOSE 126* 08/17/2014 0240   BUN 10 08/17/2014 0240   CREATININE 0.83 08/17/2014 0240   CREATININE 0.94 08/17/2012 1016   CALCIUM 8.3* 08/17/2014 0240   GFRNONAA 78* 08/17/2014 0240   GFRAA >90 08/17/2014 0240     Discharge Instructions:   The patient is discharged to home with extensive instructions on wound care and progressive ambulation.  They are instructed not to drive or perform any heavy lifting until returning to see the physician in his office.  Discharge Instructions    ABDOMINAL PROCEDURE/ANEURYSM REPAIR/AORTO-BIFEMORAL BYPASS:  Call MD for increased abdominal pain; cramping diarrhea; nausea/vomiting    Complete by:  As directed      Call MD for:  redness, tenderness, or signs of infection (pain, swelling, bleeding, redness, odor or green/yellow discharge around incision site)    Complete by:  As directed      Call MD for:  severe or increased pain, loss or decreased feeling  in affected limb(s)    Complete by:  As directed      Call MD for:  temperature >100.5    Complete by:  As directed      Driving Restrictions    Complete by:  As directed   No driving for 2 weeks     Increase activity slowly    Complete by:  As directed   Walk with assistance use walker or cane as needed     Lifting restrictions    Complete by:  As directed   No lifting for 4 weeks     Resume previous diet    Complete by:  As directed      may wash over wound with mild soap and water    Complete by:  As directed   May take off dressings and shower tomorrow 08/18/14. Routine soap and water afterwards.           Discharge Diagnosis:  Abdominal Aortic Aneurysm I71 4  Secondary Diagnosis: Patient Active Problem List   Diagnosis Date Noted  . Abdominal aortic aneurysm without rupture 08/16/2014  . AAA (abdominal aortic aneurysm) without rupture  08/01/2014  . Occlusion and stenosis of carotid artery without mention of cerebral infarction 02/14/2014  . Aftercare following surgery of the circulatory system, NEC 07/26/2013  . AAA (abdominal aortic aneurysm) 08/17/2012  . Dementia 10/17/2011  . Carotid stenosis 07/30/2011  . Confusion 07/04/2011  . Murmur 07/04/2011  . HTN (hypertension) 07/04/2011  . DYSLIPIDEMIA 10/23/2010  . CORONARY ATHEROSCLEROSIS NATIVE CORONARY ARTERY 10/23/2010  . ABDOMINAL AORTIC ANEURYSM 06/14/2009   Past Medical History  Diagnosis Date  . CAD (coronary artery disease)     S/P CABG   . Dyslipidemia   . Abdominal aortic aneurysm   . HTN (hypertension) 07/04/2011  . Diabetes mellitus     type 2.  . Depression   . Dementia     beginning stages- on aricept       Medication List    TAKE these medications        aspirin 81 MG tablet  Take 81 mg by mouth 2 (two) times daily.     atorvastatin 10 MG tablet  Commonly known as:  LIPITOR  Take 10 mg by mouth daily at 6 PM.     donepezil 10 MG tablet  Commonly known as:  ARICEPT  Take 10 mg by mouth every morning.     escitalopram 20 MG tablet  Commonly known as:  LEXAPRO  Take 20 mg by mouth at bedtime.     Memantine HCl ER 28 MG Cp24  Take 28 mg by mouth every morning.     metFORMIN 500 MG tablet  Commonly known as:  GLUCOPHAGE  Take 1 tablet (500 mg total) by mouth every evening. Restart Metformin tomorrow 08/18/14.     metoprolol 50 MG tablet  Commonly known as:  LOPRESSOR  Take 50 mg by mouth 2 (two) times daily.     multivitamin with minerals Tabs tablet  Take 0.5 tablets by mouth 2 (two) times daily.     oxyCODONE 5 MG immediate release tablet  Commonly known as:  Oxy IR/ROXICODONE  Take 1 tablet (5 mg total) by mouth every 6 (six) hours as needed for severe pain.        Roxicodone #15 No Refill  Disposition: Home  Patient's condition: is Good  Follow up: 1. Dr. Arbie CookeyEarly in 4 weeks with CTA   Maris BergerKimberly Trinh,  PA-C [facility] [phone] 08/18/2014  3:58 PM   - For VQI Registry use --- Instructions: Press F2 to tab through selections.  Delete question if not applicable.   Post-op:  Time to Extubation: [x]  In OR, [ ]  < 12 hrs, [ ]   12-24 hrs, [ ]  >=24 hrs Vasopressors Req. Post-op: No MI: No., [ ]  Troponin only, [ ]  EKG or Clinical New Arrhythmia: No CHF: No ICU Stay: 0 days Transfusion: No  If yes, 0 units given  Complications: Resp failure: No., [ ]  Pneumonia, [ ]  Ventilator Chg in renal function: No., [ ]  Inc. Cr > 0.5, [ ]  Temp. Dialysis, [ ]  Permanent dialysis Leg ischemia: No., no Surgery needed, [ ]  Yes, Surgery needed, [ ]  Amputation Bowel ischemia: No., [ ]  Medical Rx, [ ]  Surgical Rx Wound complication: No., [ ]  Superficial separation/infection, [ ]  Return to OR Return to OR: No  Return to OR for bleeding: No Stroke: No., [ ]  Minor, [ ]  Major  Discharge medications: Statin use:  Yes If No: [ ]  For Medical reasons, [ ]  Non-compliant, [ ]  Not-indicated ASA use:  Yes  If No: [ ]  For Medical reasons, [ ]  Non-compliant, [ ]  Not-indicated Plavix use:  No If No: [ ]  For Medical reasons, [ ]  Non-compliant, [ x] Not-indicated Beta blocker use:  Yes If No: [ ]  For Medical reasons, [ ]  Non-compliant, [ ]  Not-indicated

## 2014-08-21 ENCOUNTER — Telehealth: Payer: Self-pay | Admitting: Vascular Surgery

## 2014-08-21 NOTE — Telephone Encounter (Addendum)
-----   Message from Sharee PimpleMarilyn K McChesney, RN sent at 08/17/2014  9:40 AM EST ----- Regarding: Schedule   ----- Message -----    From: Raymond GurneyKimberly A Trinh, PA-C    Sent: 08/17/2014   8:33 AM      To: [facility] Charge Pool  S/p EVAR 08/16/14  F/u with Dr. Arbie CookeyEarly with CTA in 4 weeks.   Thanks, Kim   08/21/14: no answer at home, mailed pw, dpm

## 2014-08-24 ENCOUNTER — Telehealth: Payer: Self-pay | Admitting: *Deleted

## 2014-08-24 NOTE — Telephone Encounter (Signed)
Mrs. Juan Patel called to report that patient is having urinary incontinence, urgency and frequency. He had an EVAR by Dr. Arbie CookeyEarly on 08-16-14. His temp is 98.1 :  Juan Patel is not eating well and is on Metformin per his wife. After discussing the possibility of him having a UTI, I called Dr. Kreitzer Patel's office and they will see him in the morning at  8:00 am for urinalysis and appropriate medications. Wife voiced understanding that if he should get worse during the night [facility]

## 2014-09-11 ENCOUNTER — Encounter: Payer: Self-pay | Admitting: Vascular Surgery

## 2014-09-12 ENCOUNTER — Ambulatory Visit (INDEPENDENT_AMBULATORY_CARE_PROVIDER_SITE_OTHER): Payer: Medicare Other | Admitting: Vascular Surgery

## 2014-09-12 ENCOUNTER — Ambulatory Visit
Admission: RE | Admit: 2014-09-12 | Discharge: 2014-09-12 | Disposition: A | Discharge status: Home / Self Care | Payer: Medicare Other | Source: Ambulatory Visit | Attending: Vascular Surgery | Admitting: Vascular Surgery

## 2014-09-12 ENCOUNTER — Encounter: Payer: Self-pay | Admitting: Vascular Surgery

## 2014-09-12 VITALS — BP 163/77 | HR 60 | Resp 18 | Ht 74.0 in | Wt 195.0 lb

## 2014-09-12 DIAGNOSIS — I714 Abdominal aortic aneurysm, without rupture, unspecified: Secondary | ICD-10-CM

## 2014-09-12 DIAGNOSIS — Z48812 Encounter for surgical aftercare following surgery on the circulatory system: Secondary | ICD-10-CM

## 2014-09-12 DIAGNOSIS — Z95828 Presence of other vascular implants and grafts: Secondary | ICD-10-CM

## 2014-09-12 MED ORDER — IOHEXOL 350 MG/ML SOLN
80.0000 mL | Freq: Once | INTRAVENOUS | Status: AC | PRN
  Administered 2014-09-12: 80 mL via INTRAVENOUS

## 2014-09-12 NOTE — Progress Notes (Signed)
Here today for follow-up of stent graft repair of infrarenal abdominal aortic aneurysm by myself on 08/16/2014. He did well and was discharged home on postoperative day 1. He had percutaneous access through both groins. He reports that he is having slow return of his stamina. His wife reports that he is doing more shuffling when he walks and before and also has had some incontinence of his bowel and urine which was not typical for him beforehand. He does answer questions appropriately and is very animated in his telling of a recent event with his family with the above medication with multiple generations.  On physical exam his abdominal exam reveals no tenderness and no palpable pulsatile mass. He does have 2+ femoral pulses in his puncture sites of completely healed.  CT scan today of his abdomen and pelvis reveals excellent placement of the stent graft with no change in his aneurysm size of 5.8 cm. He does have a slight type II endoleak from lumbar collaterals.  Impression and plan excellent status post follow-up at one month from stent graft repair. He will continue his activity. His wife is asking whether outpatient rehabilitation may be appropriate to help him gain strength we will coordinate that there her their home. We will see him again in 6 months with repeat CT scan

## 2014-09-12 NOTE — Addendum Note (Signed)
Addended by: Adria DillELDRIDGE-LEWIS, Murna Backer L on: 09/12/2014 02:12 PM   Modules accepted: Orders

## 2014-09-14 ENCOUNTER — Other Ambulatory Visit: Payer: Self-pay

## 2014-09-14 DIAGNOSIS — Z95828 Presence of other vascular implants and grafts: Secondary | ICD-10-CM

## 2014-09-14 DIAGNOSIS — R29898 Other symptoms and signs involving the musculoskeletal system: Secondary | ICD-10-CM

## 2014-09-25 ENCOUNTER — Ambulatory Visit: Payer: Medicare Other | Attending: Vascular Surgery | Admitting: Physical Therapy

## 2014-09-25 DIAGNOSIS — M6281 Muscle weakness (generalized): Secondary | ICD-10-CM | POA: Diagnosis not present

## 2014-10-02 ENCOUNTER — Ambulatory Visit: Payer: Medicare Other | Admitting: Physical Therapy

## 2014-10-02 DIAGNOSIS — M6281 Muscle weakness (generalized): Secondary | ICD-10-CM | POA: Diagnosis not present

## 2014-10-05 ENCOUNTER — Encounter: Payer: Medicare Other | Admitting: Physical Therapy

## 2014-10-10 ENCOUNTER — Ambulatory Visit: Payer: Medicare Other | Attending: Vascular Surgery | Admitting: *Deleted

## 2014-10-10 DIAGNOSIS — M6281 Muscle weakness (generalized): Secondary | ICD-10-CM | POA: Diagnosis present

## 2014-10-12 ENCOUNTER — Ambulatory Visit: Payer: Medicare Other | Admitting: *Deleted

## 2014-10-12 DIAGNOSIS — M6281 Muscle weakness (generalized): Secondary | ICD-10-CM | POA: Diagnosis not present

## 2014-10-17 ENCOUNTER — Ambulatory Visit: Payer: Medicare Other | Admitting: *Deleted

## 2014-10-17 DIAGNOSIS — M6281 Muscle weakness (generalized): Secondary | ICD-10-CM | POA: Diagnosis not present

## 2014-10-19 ENCOUNTER — Encounter: Payer: Medicare Other | Admitting: *Deleted

## 2014-10-24 ENCOUNTER — Encounter: Payer: Medicare Other | Admitting: *Deleted

## 2014-10-26 ENCOUNTER — Encounter: Payer: Medicare Other | Admitting: *Deleted

## 2014-10-31 ENCOUNTER — Encounter: Payer: Medicare Other | Admitting: *Deleted

## 2014-11-02 ENCOUNTER — Encounter: Payer: Medicare Other | Admitting: Physical Therapy

## 2014-11-07 ENCOUNTER — Encounter: Payer: Medicare Other | Admitting: *Deleted

## 2014-11-09 ENCOUNTER — Ambulatory Visit: Payer: Medicare Other | Attending: Vascular Surgery | Admitting: *Deleted

## 2014-11-09 DIAGNOSIS — M6281 Muscle weakness (generalized): Secondary | ICD-10-CM | POA: Diagnosis present

## 2014-11-14 ENCOUNTER — Encounter: Payer: Medicare Other | Admitting: *Deleted

## 2014-11-16 ENCOUNTER — Encounter: Payer: Medicare Other | Admitting: *Deleted

## 2015-03-12 ENCOUNTER — Other Ambulatory Visit: Payer: Self-pay | Admitting: Vascular Surgery

## 2015-03-12 ENCOUNTER — Encounter: Payer: Self-pay | Admitting: Vascular Surgery

## 2015-03-12 ENCOUNTER — Other Ambulatory Visit: Payer: Self-pay | Admitting: *Deleted

## 2015-03-12 DIAGNOSIS — Z01812 Encounter for preprocedural laboratory examination: Secondary | ICD-10-CM

## 2015-03-12 DIAGNOSIS — I714 Abdominal aortic aneurysm, without rupture, unspecified: Secondary | ICD-10-CM

## 2015-03-12 DIAGNOSIS — Z4889 Encounter for other specified surgical aftercare: Secondary | ICD-10-CM

## 2015-03-13 ENCOUNTER — Ambulatory Visit
Admission: RE | Admit: 2015-03-13 | Discharge: 2015-03-13 | Disposition: A | Discharge status: Home / Self Care | Payer: Medicare Other | Source: Ambulatory Visit | Attending: Vascular Surgery | Admitting: Vascular Surgery

## 2015-03-13 ENCOUNTER — Inpatient Hospital Stay: Admission: RE | Admit: 2015-03-13 | Payer: Medicare Other | Source: Ambulatory Visit

## 2015-03-13 ENCOUNTER — Ambulatory Visit: Payer: Medicare Other | Admitting: Vascular Surgery

## 2015-03-13 DIAGNOSIS — Z4889 Encounter for other specified surgical aftercare: Secondary | ICD-10-CM

## 2015-03-13 DIAGNOSIS — I714 Abdominal aortic aneurysm, without rupture, unspecified: Secondary | ICD-10-CM

## 2015-03-13 MED ORDER — IOHEXOL 350 MG/ML SOLN
80.0000 mL | Freq: Once | INTRAVENOUS | Status: AC | PRN
  Administered 2015-03-13: 80 mL via INTRAVENOUS

## 2015-03-16 ENCOUNTER — Encounter: Payer: Self-pay | Admitting: Vascular Surgery

## 2015-03-20 ENCOUNTER — Encounter: Payer: Self-pay | Admitting: Vascular Surgery

## 2015-03-20 ENCOUNTER — Ambulatory Visit (INDEPENDENT_AMBULATORY_CARE_PROVIDER_SITE_OTHER): Payer: Medicare Other | Admitting: Vascular Surgery

## 2015-03-20 VITALS — BP 128/74 | HR 54 | Resp 18 | Ht 72.5 in | Wt 194.5 lb

## 2015-03-20 DIAGNOSIS — Z48812 Encounter for surgical aftercare following surgery on the circulatory system: Secondary | ICD-10-CM | POA: Diagnosis not present

## 2015-03-20 DIAGNOSIS — I714 Abdominal aortic aneurysm, without rupture, unspecified: Secondary | ICD-10-CM

## 2015-03-20 NOTE — Addendum Note (Signed)
Addended by: Adria Dill L on: 03/20/2015 04:25 PM   Modules accepted: Orders

## 2015-03-20 NOTE — Progress Notes (Addendum)
HISTORY AND PHYSICAL     CC:  F/u for EVAR Referring Provider:  Joette Catching, MD  HPI: This is a 81 y.o. male who underwent EVAR for infrarenal AAA by Dr. Arbie Cookey on 08/16/14.  He states that he has been a little tired lately and is not doing as much walking as he used to.  Otherwise, he states there has been no change in his health since his last visit.    He is on a statin for hypercholesterolemia.  He is on a beta blocker for his HTN.  He takes a daily aspirin.    Past Medical History  Diagnosis Date  . CAD (coronary artery disease)     S/P CABG   . Dyslipidemia   . Abdominal aortic aneurysm   . HTN (hypertension) 07/04/2011  . Diabetes mellitus     type 2.  . Depression   . Dementia     beginning stages- on aricept    Past Surgical History  Procedure Laterality Date  . Appendectomy    . Coronary artery bypass graft  Oct. 1993    RIMA to the RCA, LIMA to the LAD, SVG to diagonal, and SVG to circumflex  . Abdominal aortic endovascular stent graft N/A 08/16/2014    Procedure: ABDOMINAL AORTIC ENDOVASCULAR STENT GRAFT;  Surgeon: Larina Earthly, MD;  Location: [facility] OR;  Service: Vascular;  Laterality: N/A;    No Known Allergies  Current Outpatient Prescriptions  Medication Sig Dispense Refill  . aspirin 81 MG tablet Take 81 mg by mouth 2 (two) times daily.      Marland Kitchen atorvastatin (LIPITOR) 10 MG tablet Take 10 mg by mouth daily at 6 PM.     . donepezil (ARICEPT) 10 MG tablet Take 10 mg by mouth every morning.    . escitalopram (LEXAPRO) 20 MG tablet Take 20 mg by mouth at bedtime. Takes 1/2 tablet once daily.    . Memantine HCl ER 28 MG CP24 Take 28 mg by mouth every morning.    . metFORMIN (GLUCOPHAGE) 500 MG tablet Take 1 tablet (500 mg total) by mouth every evening. Restart Metformin tomorrow 08/18/14.    . metoprolol (LOPRESSOR) 50 MG tablet Take 50 mg by mouth 2 (two) times daily.    . Multiple Vitamin (MULTIVITAMIN WITH MINERALS) TABS tablet Take 0.5 tablets by mouth 2  (two) times daily.    . tamsulosin (FLOMAX) 0.4 MG CAPS capsule Take 0.4 mg by mouth daily after supper.    Marland Kitchen oxyCODONE (OXY IR/ROXICODONE) 5 MG immediate release tablet Take 1 tablet (5 mg total) by mouth every 6 (six) hours as needed for severe pain. (Patient not taking: Reported on 09/12/2014) 15 tablet 0   No current facility-administered medications for this visit.    Family History  Problem Relation Age of Onset  . Heart disease Father     Heart Disease before age 53  . Deep vein thrombosis Father     Varicose Veins  . Cancer Sister   . Heart disease Sister   . Hypertension Sister   . Cancer Brother   . Hypertension Daughter   . Deep vein thrombosis Mother     Varicose Veins  . Heart attack Mother   . Hypertension Sister     History   Social History  . Marital Status: Married    Spouse Name: N/A  . Number of Children: N/A  . Years of Education: N/A   Occupational History  . Not on file.   Social  History Main Topics  . Smoking status: Former Smoker -- 0.50 packs/day    Types: Cigarettes    Quit date: 10/26/1961  . Smokeless tobacco: Never Used  . Alcohol Use: 0.0 oz/week    0 Standard drinks or equivalent per week     Comment: rarely  . Drug Use: No  . Sexual Activity: Not on file   Other Topics Concern  . Not on file   Social History Narrative     ROS: [x]  Positive   [ ]  Negative   [ ]  All sytems reviewed and are negative  Cardiovascular: []  chest pain/pressure []  palpitations []  SOB lying flat []  DOE []  pain in legs while walking []  pain in feet when lying flat []  hx of DVT []  hx of phlebitis []  swelling in legs []  varicose veins  Pulmonary: []  productive cough []  asthma []  wheezing  Neurologic: []  weakness in []  arms []  legs []  numbness in []  arms []  legs [] difficulty speaking or slurred speech []  temporary loss of vision in one eye []  dizziness  Hematologic: []  bleeding problems []  problems with blood clotting easily  GI []   vomiting blood []  blood in stool  GU: []  burning with urination []  blood in urine  Psychiatric: []  hx of major depression  Integumentary: []  rashes []  ulcers  Constitutional: []  fever []  chills   PHYSICAL EXAMINATION:  Filed Vitals:   03/20/15 1307  BP: 128/74  Pulse: 54  Resp: 18   Body mass index is 26 kg/(m^2).  General:  WDWN in NAD Gait: not observed HENT: WNL, normocephalic Pulmonary: normal non-labored breathing , without Rales, rhonchi,  wheezing Cardiac: regular Abdomen: soft, NT, no masses Skin: without rashes, without ulcers  Vascular Exam/Pulses:  Right Left  Radial 2+ (normal) 2+ (normal)  Femoral 2+ (normal) 2+ (normal)  Popliteal 2+ (normal) 2+ (normal)  DP 2+ (normal) 2+ (normal)   Extremities: without ischemic changes, without Gangrene , without cellulitis; without open wounds;  Musculoskeletal: no muscle wasting or atrophy  Neurologic: A&O X 3; Appropriate Affect ; SENSATION: normal; MOTOR FUNCTION:  moving all extremities equally. Speech is fluent/normal   Non-Invasive Vascular Imaging:   CTA 03/13/15: IMPRESSION: 1. Patent infrarenal aortic stent graft with persistent type 2 endoleak and continued slight increase in native aneurysm sac diameter, now 5.9 cm. 2. 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 3. Stable ancillary non vascular findings as above.  Pt meds includes: Statin:  Yes.   Beta Blocker:  Yes.   Aspirin:  Yes.   ACEI:  No. ARB:  No. Other Antiplatelet/Anticoagulant:  No.    ASSESSMENT/PLAN:: 81 y.o. male who is s/p Gore excluder stent graft repair of abdominal aortic aneurysm on 08/16/14 by Dr. Arbie Cookey   -the pt is doing well today -CTA reviewed by Dr. Arbie Cookey and stent graft is in excellent position.  There continues to be a persistent Type 2 endoleak and slight enlargement of the aneurysm sac by 18mm.   -Will repeat CTA in one year.  If the aneurysm sac continues to enlarge, he may need coiling of a lumbar  branch off the aorta.  If it continues to stay the same, no intervention will be needed.  -pt will f/u with Dr. Arbie Cookey after CTA in one year.     Doreatha Massed, PA-C [facility] [phone]  Clinic MD:  Pt seen and examined in conjunction with Dr. Arbie Cookey  I have examined the patient, reviewed and agree with above. Doing well over all.  Had a long discussion with patient and his wife present he does have a type II endoleak related to lumbar patency. Has a slight increase in size from 6 months ago of his aneurysm sac. Discussed the significance of this. No evidence of type I endoleak. Will repeat CT scan in one year for continued follow-up  Gretta Began, MD 03/20/2015 2:11 PM

## 2015-07-30 ENCOUNTER — Other Ambulatory Visit: Payer: Self-pay | Admitting: Vascular Surgery

## 2015-07-30 DIAGNOSIS — I6529 Occlusion and stenosis of unspecified carotid artery: Secondary | ICD-10-CM

## 2015-08-09 ENCOUNTER — Ambulatory Visit (HOSPITAL_COMMUNITY)
Admission: RE | Admit: 2015-08-09 | Discharge: 2015-08-09 | Disposition: A | Discharge status: Home / Self Care | Payer: Medicare Other | Source: Ambulatory Visit | Attending: Cardiovascular Disease | Admitting: Cardiovascular Disease

## 2015-08-09 ENCOUNTER — Other Ambulatory Visit: Payer: Self-pay | Admitting: Cardiology

## 2015-08-09 DIAGNOSIS — E785 Hyperlipidemia, unspecified: Secondary | ICD-10-CM | POA: Insufficient documentation

## 2015-08-09 DIAGNOSIS — I10 Essential (primary) hypertension: Secondary | ICD-10-CM | POA: Insufficient documentation

## 2015-08-09 DIAGNOSIS — I6523 Occlusion and stenosis of bilateral carotid arteries: Secondary | ICD-10-CM | POA: Diagnosis not present

## 2015-08-09 DIAGNOSIS — E119 Type 2 diabetes mellitus without complications: Secondary | ICD-10-CM | POA: Diagnosis not present

## 2015-08-09 DIAGNOSIS — I6529 Occlusion and stenosis of unspecified carotid artery: Secondary | ICD-10-CM

## 2016-02-05 IMAGING — CT CT CTA ABD/PEL W/CM AND/OR W/O CM
3 of 7 series · 12 of 36 positions shown, 18 images · IV contrast (80CC OMNI 350)
Comparison: 09/02/2012

CLINICAL DATA: hx AAA; reported increase in size to 5.8 from
[facility] screening ; previous coronary bypass grafting, appendectomy

EXAM:
CT ANGIOGRAPHY ABDOMEN AND PELVIS
TECHNIQUE: Multidetector CT imaging of the abdomen and pelvis was performed
using the standard protocol during bolus administration of
intravenous contrast. Multiplanar reconstructed images including
MIPs were obtained and reviewed to evaluate the vascular anatomy.
CONTRAST:  80mL OMNIPAQUE IOHEXOL 350 MG/ML SOLN

[Series 4: angio · axial · 0.70mm/px · z∈[-444,-72]mm · 8 of 193 slices shown, 13 images]
[im 22/193  soft-tissue]
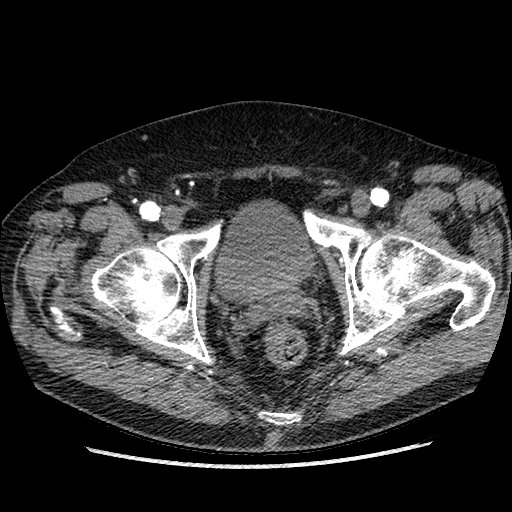
[im 22/193  bone]
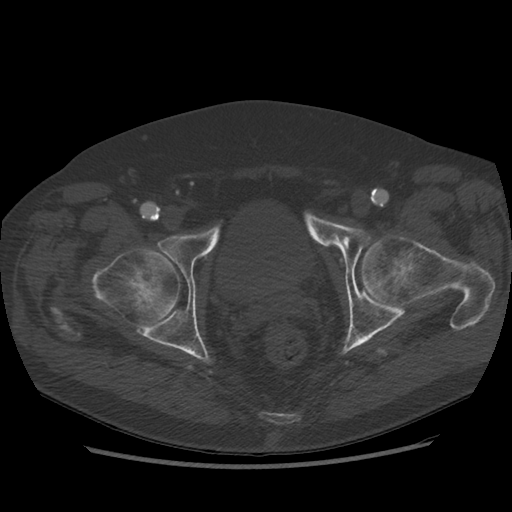
[im 43/193  soft-tissue]
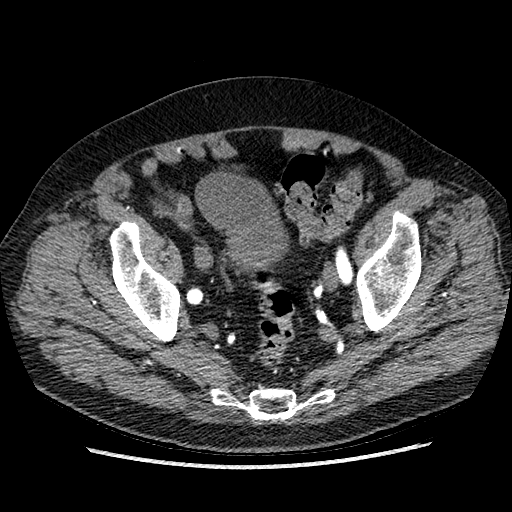
[im 65/193  soft-tissue]
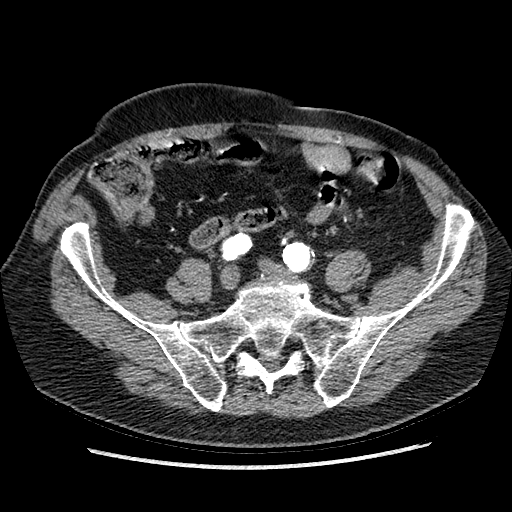
[im 86/193  soft-tissue]
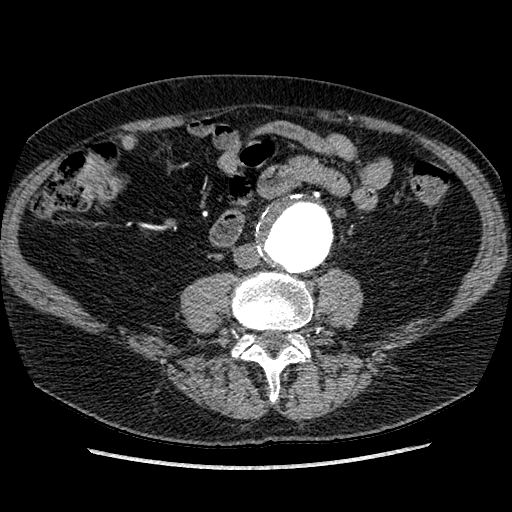
[im 107/193  soft-tissue]
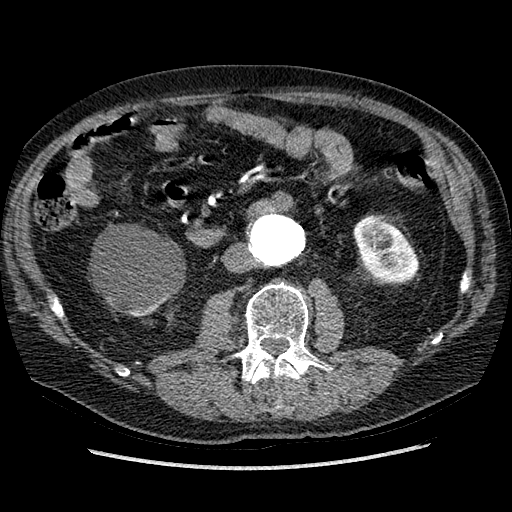
[im 107/193  lung]
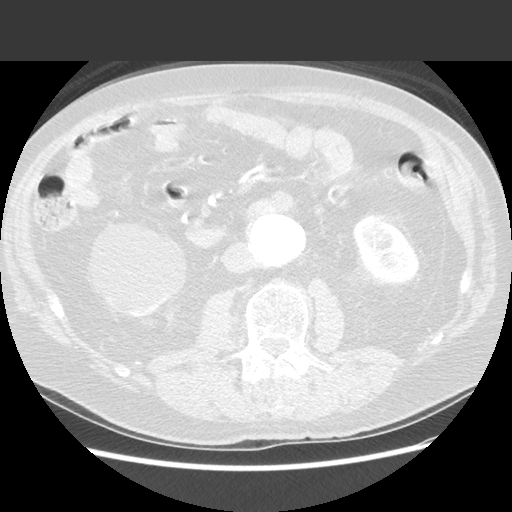
[im 129/193  soft-tissue]
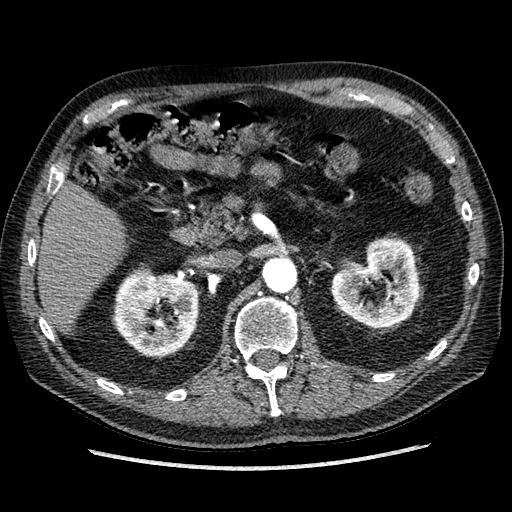
[im 129/193  lung]
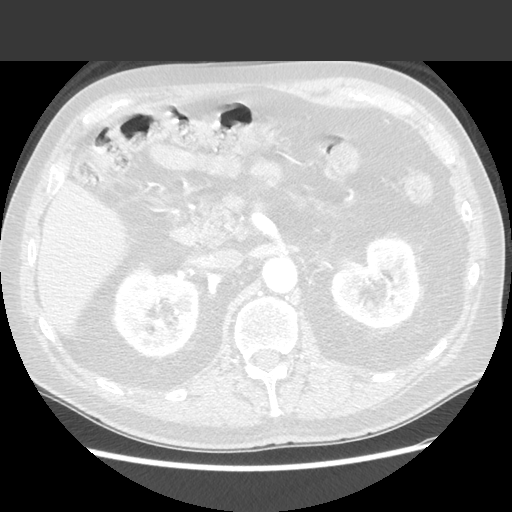
[im 150/193  soft-tissue]
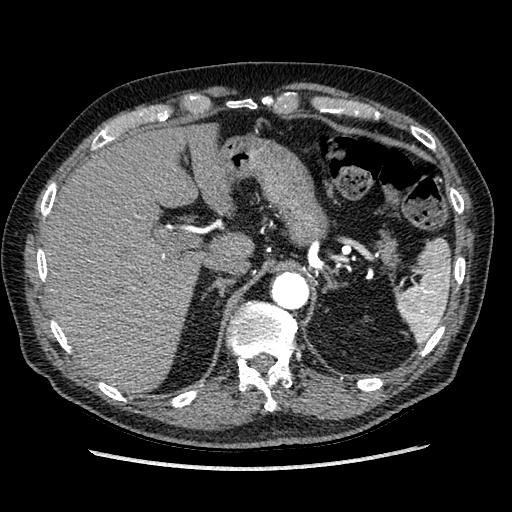
[im 150/193  lung]
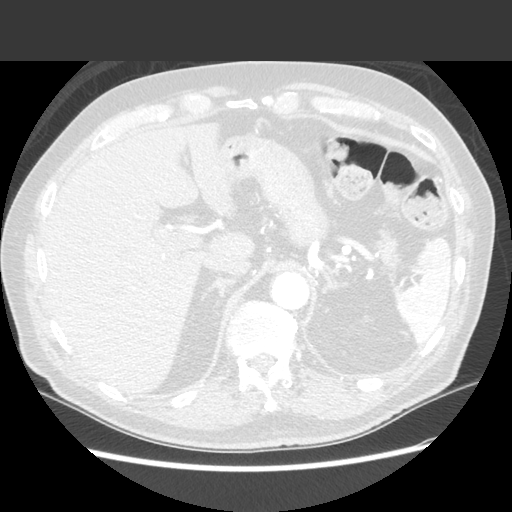
[im 171/193  soft-tissue]
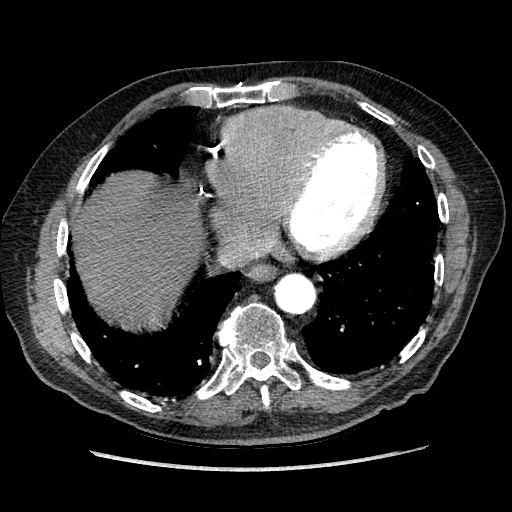
[im 171/193  lung]
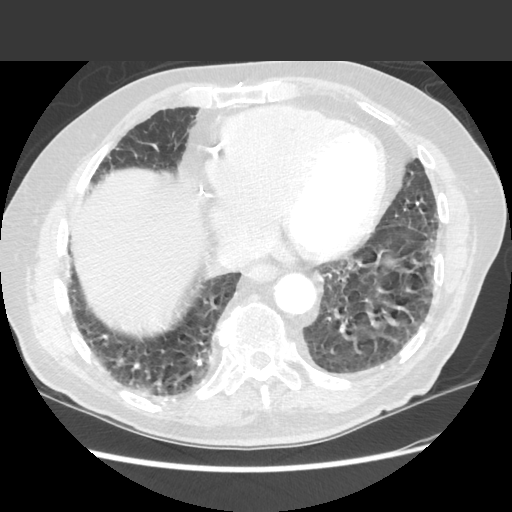

[Series 601: coronal body · coronal · 0.94mm/px · 1 of 114 slices shown, 2 images]
[im 38/114  soft-tissue]
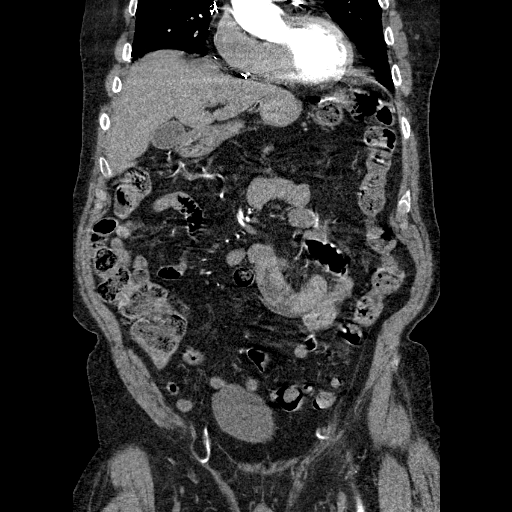
[im 38/114  bone]
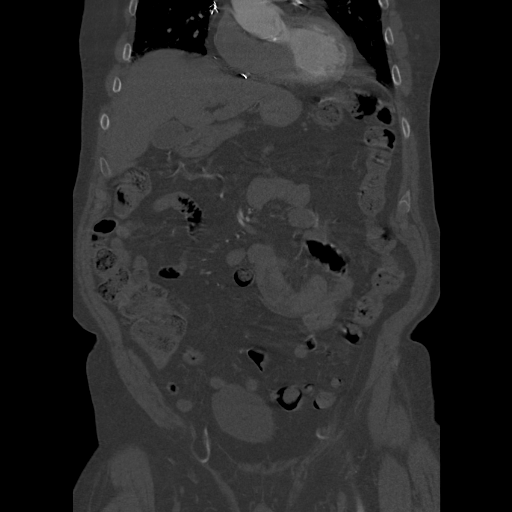

[Series 602: sagittal body · sagittal · 0.94mm/px · 3 of 145 slices shown]
[im 29/145  soft-tissue]
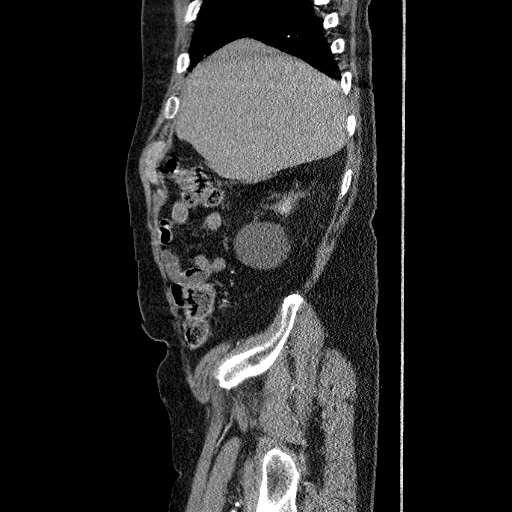
[im 58/145  soft-tissue]
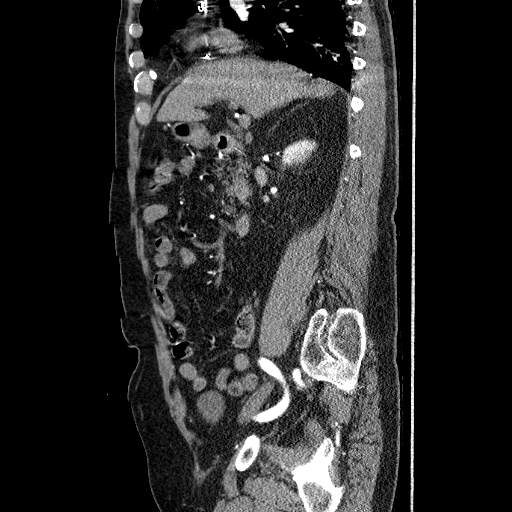
[im 87/145  soft-tissue]
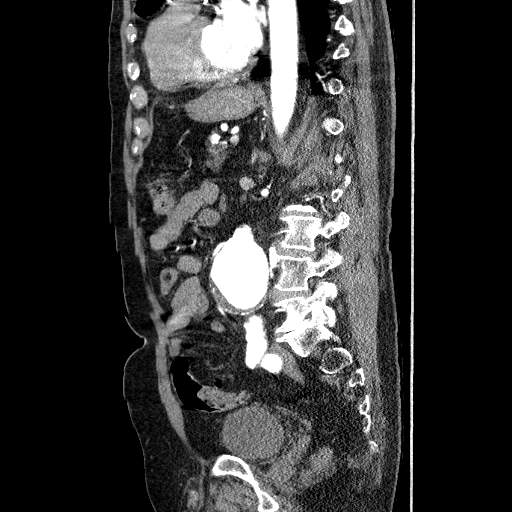

[12 of 36 positions shown; findings below may reference images not displayed]

FINDINGS: ARTERIAL FINDINGS:

Coronary calcifications and changes of CABG.

Aorta: Mild patchy atheromatous plaque in the visualized distal
descending thoracic and suprarenal segments. There is a bilobed
infrarenal aneurysm, 5.7 cm maximum diameter image 105/4 (Previously
5.1 cm). Aorta tapers to a diameter of 3.6 cm at the bifurcation.
There is some eccentric mural thrombus in the aneurysmal segment. No
dissection or stenosis.

Celiac axis: Mild origin stenosis at the level of the median arcuate
ligament of the diaphragm, patent distally

Superior mesenteric: Partially calcified ostial plaque without
significant stenosis, unremarkable distal branch anatomy.

Left renal:           Single, widely patent.

Right renal:          Single, widely patent

Inferior mesenteric: Patent, arising just proximal to the dominant
aneurysmal segment of the aorta

Left iliac: Common iliac dilated proximally to 2.5 cm (previously
2.4 cm). The proximal internal iliac is dilated to 15 mm (previously
14 mm). External iliac is mildly tortuous and ectatic with some
scattered plaque, no stenosis.

Right iliac: Ectatic common iliac measures up to 16 mm diameter just
proximal to its bifurcation. Ectatic internal and external iliac
arteries without stenosis.

Venous findings:      Dedicated venous phase imaging not obtained.

Review of the MIP images confirms the above findings.

Nonvascular findings: Previous median sternotomy. Stable 5 mm
subpleural nodule, right middle lobe. If Minimal dependent
atelectasis in the visualized lung bases. Unremarkable liver,
nondilated gallbladder, spleen, adrenal glands. Diffuse pancreatic
parenchymal atrophy. 7.9 cm exophytic cyst from the lower pole right
kidney. No hydronephrosis. Stomach, small bowel, and colon are
nondilated. Scattered sigmoid diverticula. Urinary bladder
physiologically distended. Mild prostatic enlargement if with
central coarse calcification. No ascites. No free air. No adenopathy
localized. Mild spondylitic changes in the lumbar spine.
IMPRESSION: 1. Interval enlargement of infrarenal 5.7 cm abdominal aortic
aneurysm (previously 5.1 cm on 08/31/2012).
2. 2.5 cm left common iliac and 15 mm left internal iliac artery
aneurysms, slightly increased.

## 2016-03-17 ENCOUNTER — Encounter: Payer: Self-pay | Admitting: Vascular Surgery

## 2016-03-18 ENCOUNTER — Other Ambulatory Visit: Payer: Medicare Other

## 2016-03-18 ENCOUNTER — Inpatient Hospital Stay: Admission: RE | Admit: 2016-03-18 | Payer: Medicare Other | Source: Ambulatory Visit

## 2016-03-25 ENCOUNTER — Ambulatory Visit (INDEPENDENT_AMBULATORY_CARE_PROVIDER_SITE_OTHER): Payer: Medicare Other | Admitting: Vascular Surgery

## 2016-03-25 ENCOUNTER — Encounter: Payer: Self-pay | Admitting: Vascular Surgery

## 2016-03-25 ENCOUNTER — Ambulatory Visit
Admission: RE | Admit: 2016-03-25 | Discharge: 2016-03-25 | Disposition: A | Discharge status: Home / Self Care | Payer: Medicare Other | Source: Ambulatory Visit | Attending: Vascular Surgery | Admitting: Vascular Surgery

## 2016-03-25 VITALS — BP 139/69 | HR 46 | Temp 97.9°F | Resp 20 | Ht 74.0 in | Wt 185.0 lb

## 2016-03-25 DIAGNOSIS — I714 Abdominal aortic aneurysm, without rupture, unspecified: Secondary | ICD-10-CM

## 2016-03-25 DIAGNOSIS — Z48812 Encounter for surgical aftercare following surgery on the circulatory system: Secondary | ICD-10-CM

## 2016-03-25 MED ORDER — IOPAMIDOL (ISOVUE-370) INJECTION 76%
75.0000 mL | Freq: Once | INTRAVENOUS | Status: AC | PRN
  Administered 2016-03-25: 75 mL via INTRAVENOUS

## 2016-03-25 NOTE — Progress Notes (Signed)
[facility] [place]  Patient name: Juan Patel Wigger MRN: [id number] DOB: 06/22/1926 Sex: male  REASON FOR VISIT: follow-up EVAR  HPI: Juan Patel Ruttan is a 82 y.o. male who presents for continued follow-up status post endovascular repair of his infrarenal abdominal aortic aneurysm on 08/16/2014. At his last office visit one year ago, there was a type II endoleak via lumbar artery with a slight increase in the native aneurysm size.  The patient is quiet and sleepy during the interview and his wife and daughter provides most of the history. They described a significant decrease in his activity level since January of this year. The patient has been complaining of more fatigue and loss of appetite. He also has lost control of elimination. The patient is mostly sedentary. Went to the dentist recently and was found to have a dental abscess. He is being seen by his primary care provider who is working up his fatigue.  He has a past medical history of CAD status post CABG, hyperlipidemia on a statin, and hypertension on a beta blocker. He takes a daily aspirin.    Past Medical History  Diagnosis Date  . CAD (coronary artery disease)     S/P CABG   . Dyslipidemia   . Abdominal aortic aneurysm (HCC)   . HTN (hypertension) 07/04/2011  . Diabetes mellitus     type 2.  . Depression   . Dementia     beginning stages- on aricept    Family History  Problem Relation Age of Onset  . Heart disease Father     Heart Disease before age 80  . Deep vein thrombosis Father     Varicose Veins  . Cancer Sister   . Heart disease Sister   . Hypertension Sister   . Cancer Brother   . Hypertension Daughter   . Deep vein thrombosis Mother     Varicose Veins  . Heart attack Mother   . Hypertension Sister     SOCIAL HISTORY: Social History  Substance Use Topics  . Smoking status: Former Smoker -- 0.50 packs/day    Types: Cigarettes    Quit date: 10/26/1961  . Smokeless tobacco:  Never Used  . Alcohol Use: 0.0 oz/week    0 Standard drinks or equivalent per week     Comment: rarely    No Known Allergies  Current Outpatient Prescriptions  Medication Sig Dispense Refill  . amoxicillin (AMOXIL) 500 MG capsule Take 500 mg by mouth 3 (three) times daily.    Marland Kitchen. aspirin 81 MG tablet Take 81 mg by mouth 2 (two) times daily.      Marland Kitchen. atorvastatin (LIPITOR) 10 MG tablet Take 10 mg by mouth daily at 6 PM.     . donepezil (ARICEPT) 10 MG tablet Take 10 mg by mouth every morning.    . escitalopram (LEXAPRO) 20 MG tablet Take 20 mg by mouth at bedtime. Takes 1/2 tablet once daily.    . Memantine HCl ER 28 MG CP24 Take 28 mg by mouth every morning.    . metFORMIN (GLUCOPHAGE) 500 MG tablet Take 1 tablet (500 mg total) by mouth every evening. Restart Metformin tomorrow 08/18/14. (Patient taking differently: Take 1,000 mg by mouth every evening. Restart Metformin tomorrow 08/18/14.)    . metFORMIN (GLUCOPHAGE) 500 MG tablet Take 500 mg by mouth daily with breakfast.    . metoprolol (LOPRESSOR) 50 MG tablet Take 50 mg by mouth 2 (two) times daily.    . Multiple  Vitamin (MULTIVITAMIN WITH MINERALS) TABS tablet Take 0.5 tablets by mouth 2 (two) times daily.    . tamsulosin (FLOMAX) 0.4 MG CAPS capsule Take 0.4 mg by mouth daily after supper.    Marland Kitchen oxyCODONE (OXY IR/ROXICODONE) 5 MG immediate release tablet Take 1 tablet (5 mg total) by mouth every 6 (six) hours as needed for severe pain. (Patient not taking: Reported on 09/12/2014) 15 tablet 0   No current facility-administered medications for this visit.    REVIEW OF SYSTEMS:   denotes positive finding,  denotes negative finding Cardiac  Comments:  Chest pain or chest pressure:    Shortness of breath upon exertion:    Short of breath when lying flat:    Irregular heart rhythm:        Vascular    Pain in calf, thigh, or hip brought on by ambulation:    Pain in feet at night that wakes you up from your sleep:     Blood clot  in your veins:    Leg swelling:         Pulmonary    Oxygen at home:    Productive cough:     Wheezing:         Neurologic    Sudden weakness in arms or legs:     Sudden numbness in arms or legs:     Sudden onset of difficulty speaking or slurred speech:    Temporary loss of vision in one eye:     Problems with dizziness:         Gastrointestinal    Blood in stool:     Vomited blood:         Genitourinary    Burning when urinating:     Blood in urine:        Psychiatric    Major depression:         Hematologic    Bleeding problems:    Problems with blood clotting too easily:        Skin    Rashes or ulcers:        Constitutional    Fever or chills:      PHYSICAL EXAM: Filed Vitals:   03/25/16 1315  BP: 139/69  Pulse: 46  Temp: 97.9 F (36.6 C)  TempSrc: Oral  Resp: 20  Height:  (1.88 m)  Weight: 185 lb (83.915 kg)    GENERAL: The patient is a well-nourished male, in no acute distress. The vital signs are documented above. CARDIAC: There is a regular rate and rhythm. No carotid bruits. VASCULAR: 2+ radial pulses bilaterally, 2+ femoral pulses bilaterally, 2+ dorsalis pedis pulses bilaterally. PULMONARY: There is good air exchange bilaterally without wheezing or rales. ABDOMEN: Soft and non-tender. MUSCULOSKELETAL: There are no major deformities or cyanosis. NEUROLOGIC: No focal weakness or paresthesias are detected. SKIN: There are no ulcers or rashes noted. PSYCHIATRIC: The patient is sleepy.  DATA:  CTA abdomen and pelvis 03/25/2016 reviewed with Dr. Arbie Cookey. There is a type II endoleak via a patent lumbar artery. There has been a minimal increase in the native aneurysm sac.  Previously, 5.9 cm. Now measuring up to 6.1 cm.  MEDICAL ISSUES: Status post endovascular repair of abdominal aortic aneurysm  The patient continues to have a type II endoleak via a patent lumbar artery. There has been a minimal 2 mm increase in the native aneurysm sac. There  is no evidence of type I endoleak. Discussed that the risk of rupture is very  low. The patient will follow-up in 1 year with a duplex.  Regarding his generalized weakness, the patient is being worked up by his primary care provider.  Maris Berger, PA-C [facility] [place]      I have examined the patient, reviewed and agree with above. Discussed with patient, wife and daughter present. Stable type II endoleak. His had generalized overall deterioration in his health status. We'll see him in one year for repeat duplex  Gretta Began, MD 03/25/2016 2:12 PM

## 2016-04-04 ENCOUNTER — Encounter: Payer: Self-pay | Admitting: Cardiology

## 2016-10-07 ENCOUNTER — Telehealth: Payer: Self-pay

## 2016-10-07 NOTE — Telephone Encounter (Signed)
Phone call from pt's daughter and wife.  Stated they are en route to [facility] in [place], with pt., per direction of Dr. Lysbeth GalasNyland, his PCP.  Reported the pt. has had increased frequency in falling.  Voiced concern about potential injury to the stent graft repair of AAA.  Daughter stated she was present for pt's appt. with Dr. Arbie CookeyEarly 03/2016, and was made aware of a small leak in the aneurysm.  Reported that her father's health has declined, and he now has increased dementia/ alzheimers, and has had a progression in weakness.  Verbalized that she feels it is unsafe for him to be at home.  Reported he was [facility] at [facility], following a fall at home.  Was advised by the ER MD to check into getting pt. [facility] for [facility].  Reported that pt. Larey SeatFell again today.  Stated her father c/o tenderness in the abdomen, and then drifted off to sleep.  Stated it is difficult to get clear symptoms from the pt.  Daughter was inquiring about what information to tell the MD at [facility] regarding the aneurysm.  Advised of date of EVAR. (08/16/14) Advised to have pt's wife to request medical records be transferred, so the appropriate information is avail. to the doctors caring for the pt.  Also advised if there is a need to discuss changes in the EVAR/ Endoleak, the [facility] could contact the MD on call for [facility].  Daughter verb. Understanding.

## 2016-12-08 ENCOUNTER — Telehealth: Payer: Self-pay | Admitting: Vascular Surgery

## 2016-12-08 NOTE — Telephone Encounter (Signed)
Patient's wife Juan Patel called to cancel her husband's upcoming appt in 03/2017 for his 3 yr EVAR and to see TFE for followup. She states he is currently in a nursing facility and will call back once she can commit to keeping an appt for him in June 2018. We had called her and left a message regarding rescheduling the 03/31/17 appt due to TFE being on vacation and out of the office.  awt

## 2017-02-17 ENCOUNTER — Other Ambulatory Visit (HOSPITAL_COMMUNITY): Payer: Self-pay | Admitting: Physician Assistant

## 2017-02-17 DIAGNOSIS — R1319 Other dysphagia: Secondary | ICD-10-CM

## 2017-02-19 ENCOUNTER — Ambulatory Visit (HOSPITAL_COMMUNITY): Payer: Medicare Other

## 2017-02-19 ENCOUNTER — Telehealth (HOSPITAL_COMMUNITY): Payer: Self-pay | Admitting: Speech Pathology

## 2017-02-19 NOTE — Telephone Encounter (Signed)
[facility] order scanned in epic for this apptment, ok per DP.Dabney also states to leave this apptment as is, if it needs to change she will let me know. Spoke to MB to keep her informed about this pt.  NF 5/17/1/8.

## 2017-02-24 ENCOUNTER — Encounter (HOSPITAL_COMMUNITY): Payer: Self-pay | Admitting: Speech Pathology

## 2017-02-24 ENCOUNTER — Ambulatory Visit (HOSPITAL_COMMUNITY)
Admission: RE | Admit: 2017-02-24 | Discharge: 2017-02-24 | Disposition: A | Discharge status: Home / Self Care | Payer: Medicare Other | Source: Ambulatory Visit | Attending: Physician Assistant | Admitting: Physician Assistant

## 2017-02-24 ENCOUNTER — Ambulatory Visit (HOSPITAL_COMMUNITY): Payer: Medicare Other | Attending: Physician Assistant | Admitting: Speech Pathology

## 2017-02-24 DIAGNOSIS — Z8701 Personal history of pneumonia (recurrent): Secondary | ICD-10-CM | POA: Diagnosis not present

## 2017-02-24 DIAGNOSIS — R1312 Dysphagia, oropharyngeal phase: Secondary | ICD-10-CM | POA: Insufficient documentation

## 2017-02-24 DIAGNOSIS — R1314 Dysphagia, pharyngoesophageal phase: Secondary | ICD-10-CM | POA: Insufficient documentation

## 2017-02-24 DIAGNOSIS — R1319 Other dysphagia: Secondary | ICD-10-CM

## 2017-02-24 NOTE — Therapy (Signed)
[facility] [address] [place], [place], [address] Phone: [phone]   Fax:  [phone]  Modified Barium Swallow  Patient Details  Name: Juan Patel MRN: [id number] Date of Birth: 1926/01/15 No Data Recorded  Encounter Date: 02/24/2017      End of Session - 02/24/17 1931    Visit Number 1   Number of Visits 1   Authorization Type medicare   SLP Start Time 1300   SLP Stop Time  1355   SLP Time Calculation (min) 55 min   Activity Tolerance Patient tolerated treatment well      Past Medical History:  Diagnosis Date  . Abdominal aortic aneurysm (HCC)   . CAD (coronary artery disease)    S/P CABG   . Dementia    beginning stages- on aricept  . Depression   . Diabetes mellitus    type 2.  . Dyslipidemia   . HTN (hypertension) 07/04/2011    Past Surgical History:  Procedure Laterality Date  . ABDOMINAL AORTIC ENDOVASCULAR STENT GRAFT N/A 08/16/2014   Procedure: ABDOMINAL AORTIC ENDOVASCULAR STENT GRAFT;  Surgeon: Larina Earthlyodd F Early, MD;  [facility];  Service: Vascular;  Laterality: N/A;  . APPENDECTOMY    . CORONARY ARTERY BYPASS GRAFT  Oct. 1993   RIMA to the RCA, LIMA to the LAD, SVG to diagonal, and SVG to circumflex    There were no vitals filed for this visit.      Subjective Assessment - 02/24/17 1924    Subjective "Does he have pneumonia?" -Pt's daughter   Patient is accompained by: Family member   Special Tests MBSS   Currently in Pain? No/denies             General - 02/24/17 1925      General Information   Date of Onset 02/20/17   HPI Juan Patel is an 83 yo male who was referred by Celso Amyina Garrett, PA-C for MBSS due to concerns regarding possible aspiration. [facility] following several falls and medical decline. Pt's current diet is D1/puree and thin liquids, however Pt with increased congestion post meals and coughing at times. Pt with  dementia.   Type of Study MBS-Modified Barium Swallow Study   Previous Swallow Assessment none on record   Diet Prior to this Study Dysphagia 1 (puree);Thin liquids   Temperature Spikes Noted No   Respiratory Status Room air   History of Recent Intubation No   Behavior/Cognition Lethargic/Drowsy;Requires cueing   Oral Cavity Assessment Within Functional Limits   Oral Care Completed by SLP No   Oral Cavity - Dentition Missing dentition   Vision Impaired for self feeding   Self-Feeding Abilities Total assist   Patient Positioning Upright in chair;Postural control interferes with function  Pt leans to Right   Baseline Vocal Quality Normal   Volitional Cough Strong   Volitional Swallow Unable to elicit   Anatomy Within functional limits   Pharyngeal Secretions Not observed secondary MBS            Oral Preparation/Oral Phase - 02/24/17 2012      Oral Preparation/Oral Phase   Oral Phase Impaired     Oral - Solids   Oral - Puree Piecemeal swallowing   Oral - Regular Piecemeal swallowing;Delayed A-P transit     Electrical stimulation - Oral Phase   Was Electrical Stimulation Used No          Pharyngeal  Phase - 03-20-2017 2012      Pharyngeal Phase   Pharyngeal Phase Impaired     Pharyngeal - Nectar   Pharyngeal- Nectar Straw Swallow initiation at pyriform sinus;Lateral channel residue     Pharyngeal - Thin   Pharyngeal- Thin Teaspoon Swallow initiation at vallecula   Pharyngeal- Thin Cup Delayed swallow initiation;Swallow initiation at pyriform sinus;Reduced tongue base retraction;Penetration/Aspiration during swallow;Pharyngeal residue - valleculae;Lateral channel residue   Pharyngeal Material does not enter airway;Material enters airway, remains ABOVE vocal cords then ejected out   Pharyngeal- Thin Straw Swallow initiation at pyriform sinus;Reduced airway/laryngeal closure;Penetration/Aspiration during swallow;Trace aspiration;Pharyngeal residue - valleculae;Lateral  channel residue   Pharyngeal Material enters airway, passes BELOW cords without attempt by patient to eject out (silent aspiration);Material does not enter airway;Material enters airway, remains ABOVE vocal cords then ejected out     Pharyngeal - Solids   Pharyngeal- Puree Swallow initiation at vallecula   Pharyngeal- Regular Delayed swallow initiation-vallecula;Reduced tongue base retraction;Penetration/Apiration after swallow   Pharyngeal Material does not enter airway;Material enters airway, CONTACTS cords and then ejected out  trace amount   Pharyngeal- Pill Within functional limits     Electrical Stimulation - Pharyngeal Phase   Was Electrical Stimulation Used No          Cricopharyngeal Phase - 20-Mar-2017 2016      Cervical Esophageal Phase   Cervical Esophageal Phase Within functional limits  mild CP prominence           Plan - 03-20-17 1931    Clinical Impression Statement Pt assessed in the lateral position with barium tinged thin, NTL, puree, regular textures, and barium tablet. Pt was leaning to the right and bending forward for most images despite respositioning. Pharyngeal anatomy is significant for capacious pharynx/pyriforms. Mild/mod oropharyngeal phase dysphagia with reduced lingual control, premature spillage with thin liquids and Pt with consistent delay in swallow initiation with swallow trigger after completely filling the pyriforms. Pt with variable trace penetration of thins when taking cup sips due to delay. Pt demonstrated trace, silent aspiration of thin liquids during the swallow when taking straw sips thin due to overload in pharynx and reduced laryngeal closure. A cued cough was effective in clearing the trace aspirate. Pt also had one episode of penetration to the vocal folds with what is suspected to be thin liquid residuals from valleculae spilling down after presentation of regular textures. Pooling in Right lateral channel prevalent throughout the  evaluation due to Pt leaning to the Right. Recommend D2/mech soft ground with thin liquids via cup or teaspoon presentation; NO STRAWS; Oral care before and after PO to minimize associated risks with aspiration. Feed Pt slowly. Treating SLP may choose to downgrade liquids to NTL during meals if this is more comfortable for pt (ie. if it decreases coughing- not seen today, but daughter reports). Pt's daughter requested that this report be sent to his PCP, Dr. Lysbeth Galas.    Consulted and Agree with Plan of Care Family member/caregiver      Patient will benefit from skilled therapeutic intervention in order to improve the following deficits and impairments:   Dysphagia, oropharyngeal phase      G-Codes - March 20, 2017 1931    Functional Assessment Tool Used Clinical judgment; MBSS   Functional Limitations Swallowing   Swallow Current Status (Z6109) At least 20 percent but less than 40 percent impaired, limited or restricted   Swallow Goal Status (U0454) At least 20 percent but less than 40 percent impaired, limited or restricted  Swallow Discharge Status [phone]) At least 20 percent but less than 40 percent impaired, limited or restricted          Recommendations/Treatment - 02/24/17 2016      Swallow Evaluation Recommendations   SLP Diet Recommendations Dysphagia 2 (chopped);Thin   Liquid Administration via No straw;Cup;Spoon   Medication Administration Whole meds with puree   Supervision Full assist for feeding;Staff to assist with self feeding;Full supervision/cueing for compensatory strategies   Compensations Minimize environmental distractions;Slow rate;Small sips/bites;Multiple dry swallows after each bite/sip;Clear throat intermittently   Postural Changes Seated upright at 90 degrees;Remain upright for at least 30 minutes after feeds/meals          Prognosis - 02/24/17 2017      Prognosis   Prognosis for Safe Diet Advancement Fair   Barriers to Reach Goals Cognitive deficits      Individuals Consulted   Consulted and Agree with Results and Recommendations Patient;Family member/caregiver   Family Member Consulted wife and daughter   Report Sent to  Referring physician;Facility (Comment)  and PCP      Problem List Patient Active Problem List   Diagnosis Date Noted  . Aftercare following surgery of the circulatory system 03/25/2016  . Abdominal aortic aneurysm without rupture (HCC) 08/16/2014  . AAA (abdominal aortic aneurysm) without rupture (HCC) 08/01/2014  . Occlusion and stenosis of carotid artery without mention of cerebral infarction 02/14/2014  . Aftercare following surgery of the circulatory system, NEC 07/26/2013  . AAA (abdominal aortic aneurysm) (HCC) 08/17/2012  . Dementia 10/17/2011  . Carotid stenosis 07/30/2011  . Confusion 07/04/2011  . Murmur 07/04/2011  . HTN (hypertension) 07/04/2011  . DYSLIPIDEMIA 10/23/2010  . CORONARY ATHEROSCLEROSIS NATIVE CORONARY ARTERY 10/23/2010  . ABDOMINAL AORTIC ANEURYSM 06/14/2009   Thank you,  Havery Moros, CCC-SLP [phone]  [facility] 02/24/2017, 8:34 PM   [facility] [address] [place], [place], [address] Phone: [phone]   Fax:  [phone]  Name: Juan Patel MRN: [id number] Date of Birth: [birth year]

## 2017-03-09 ENCOUNTER — Other Ambulatory Visit (HOSPITAL_COMMUNITY): Payer: Self-pay | Admitting: Specialist

## 2017-03-09 DIAGNOSIS — R1319 Other dysphagia: Secondary | ICD-10-CM

## 2017-03-16 ENCOUNTER — Ambulatory Visit (HOSPITAL_COMMUNITY): Payer: Medicare Other | Attending: Physician Assistant | Admitting: Speech Pathology

## 2017-03-16 ENCOUNTER — Inpatient Hospital Stay (HOSPITAL_COMMUNITY): Admission: RE | Admit: 2017-03-16 | Payer: Medicare Other | Source: Ambulatory Visit

## 2017-03-31 ENCOUNTER — Other Ambulatory Visit (HOSPITAL_COMMUNITY): Payer: Medicare Other

## 2017-03-31 ENCOUNTER — Ambulatory Visit: Payer: Medicare Other | Admitting: Vascular Surgery

## 2017-04-07 ENCOUNTER — Ambulatory Visit: Payer: Medicare Other | Admitting: Vascular Surgery

## 2018-07-14 ENCOUNTER — Other Ambulatory Visit: Payer: Self-pay

## 2018-07-14 ENCOUNTER — Inpatient Hospital Stay (HOSPITAL_COMMUNITY)
Admission: EM | Admit: 2018-07-14 | Discharge: 2018-08-06 | DRG: 178 | Disposition: E | Payer: Medicare Other | Attending: Internal Medicine | Admitting: Internal Medicine

## 2018-07-14 ENCOUNTER — Encounter (HOSPITAL_COMMUNITY): Payer: Self-pay | Admitting: Emergency Medicine

## 2018-07-14 ENCOUNTER — Emergency Department (HOSPITAL_COMMUNITY): Payer: Medicare Other

## 2018-07-14 DIAGNOSIS — I480 Paroxysmal atrial fibrillation: Secondary | ICD-10-CM | POA: Diagnosis present

## 2018-07-14 DIAGNOSIS — Z951 Presence of aortocoronary bypass graft: Secondary | ICD-10-CM

## 2018-07-14 DIAGNOSIS — Z7984 Long term (current) use of oral hypoglycemic drugs: Secondary | ICD-10-CM

## 2018-07-14 DIAGNOSIS — J690 Pneumonitis due to inhalation of food and vomit: Principal | ICD-10-CM | POA: Diagnosis present

## 2018-07-14 DIAGNOSIS — Z66 Do not resuscitate: Secondary | ICD-10-CM | POA: Diagnosis present

## 2018-07-14 DIAGNOSIS — Z7189 Other specified counseling: Secondary | ICD-10-CM

## 2018-07-14 DIAGNOSIS — D62 Acute posthemorrhagic anemia: Secondary | ICD-10-CM | POA: Diagnosis present

## 2018-07-14 DIAGNOSIS — E785 Hyperlipidemia, unspecified: Secondary | ICD-10-CM | POA: Diagnosis present

## 2018-07-14 DIAGNOSIS — D649 Anemia, unspecified: Secondary | ICD-10-CM

## 2018-07-14 DIAGNOSIS — F0280 Dementia in other diseases classified elsewhere without behavioral disturbance: Secondary | ICD-10-CM | POA: Diagnosis present

## 2018-07-14 DIAGNOSIS — Z7982 Long term (current) use of aspirin: Secondary | ICD-10-CM

## 2018-07-14 DIAGNOSIS — I10 Essential (primary) hypertension: Secondary | ICD-10-CM | POA: Diagnosis present

## 2018-07-14 DIAGNOSIS — Z79899 Other long term (current) drug therapy: Secondary | ICD-10-CM

## 2018-07-14 DIAGNOSIS — Z87891 Personal history of nicotine dependence: Secondary | ICD-10-CM

## 2018-07-14 DIAGNOSIS — I2510 Atherosclerotic heart disease of native coronary artery without angina pectoris: Secondary | ICD-10-CM | POA: Diagnosis present

## 2018-07-14 DIAGNOSIS — R112 Nausea with vomiting, unspecified: Secondary | ICD-10-CM | POA: Diagnosis not present

## 2018-07-14 DIAGNOSIS — E119 Type 2 diabetes mellitus without complications: Secondary | ICD-10-CM | POA: Diagnosis present

## 2018-07-14 DIAGNOSIS — Z515 Encounter for palliative care: Secondary | ICD-10-CM | POA: Diagnosis present

## 2018-07-14 DIAGNOSIS — G309 Alzheimer's disease, unspecified: Secondary | ICD-10-CM | POA: Diagnosis present

## 2018-07-14 DIAGNOSIS — K920 Hematemesis: Secondary | ICD-10-CM | POA: Diagnosis present

## 2018-07-14 DIAGNOSIS — N400 Enlarged prostate without lower urinary tract symptoms: Secondary | ICD-10-CM | POA: Diagnosis present

## 2018-07-14 DIAGNOSIS — Z8249 Family history of ischemic heart disease and other diseases of the circulatory system: Secondary | ICD-10-CM

## 2018-07-14 DIAGNOSIS — R042 Hemoptysis: Secondary | ICD-10-CM

## 2018-07-14 LAB — GLUCOSE, CAPILLARY: Glucose-Capillary: 162 mg/dL — ABNORMAL HIGH (ref 70–99)

## 2018-07-14 LAB — COMPREHENSIVE METABOLIC PANEL
ALT: 16 U/L (ref 0–44)
ANION GAP: 8 (ref 5–15)
AST: 17 U/L (ref 15–41)
Albumin: 3.2 g/dL — ABNORMAL LOW (ref 3.5–5.0)
Alkaline Phosphatase: 56 U/L (ref 38–126)
BUN: 21 mg/dL (ref 8–23)
CO2: 28 mmol/L (ref 22–32)
Calcium: 9 mg/dL (ref 8.9–10.3)
Chloride: 104 mmol/L (ref 98–111)
Creatinine, Ser: 0.73 mg/dL (ref 0.61–1.24)
GFR calc Af Amer: >60 mL/min (ref >60)
GFR calc non Af Amer: >60 mL/min (ref >60)
GLUCOSE: 135 mg/dL — AB (ref 70–99)
POTASSIUM: 4.4 mmol/L (ref 3.5–5.1)
SODIUM: 140 mmol/L (ref 135–145)
Total Bilirubin: 0.4 mg/dL (ref 0.3–1.2)
Total Protein: 6.5 g/dL (ref 6.5–8.1)

## 2018-07-14 LAB — CBC WITH DIFFERENTIAL/PLATELET
Abs Immature Granulocytes: 0.08 10*3/uL — ABNORMAL HIGH (ref 0.00–0.07)
Basophils Absolute: 0.1 10*3/uL (ref 0.0–0.1)
Basophils Relative: 0 %
Eosinophils Absolute: 0.7 10*3/uL — ABNORMAL HIGH (ref 0.0–0.5)
Eosinophils Relative: 5 %
HEMATOCRIT: 38.2 % — AB (ref 39.0–52.0)
Hemoglobin: 12 g/dL — ABNORMAL LOW (ref 13.0–17.0)
IMMATURE GRANULOCYTES: 1 %
LYMPHS ABS: 3.5 10*3/uL (ref 0.7–4.0)
Lymphocytes Relative: 26 %
MCH: 32.2 pg (ref 26.0–34.0)
MCHC: 31.4 g/dL (ref 30.0–36.0)
MCV: 102.4 fL — AB (ref 80.0–100.0)
MONOS PCT: 6 %
Monocytes Absolute: 0.8 10*3/uL (ref 0.1–1.0)
NEUTROS ABS: 8.5 10*3/uL — AB (ref 1.7–7.7)
NEUTROS PCT: 62 %
Platelets: 238 10*3/uL (ref 150–400)
RBC: 3.73 MIL/uL — ABNORMAL LOW (ref 4.22–5.81)
RDW: 13.2 % (ref 11.5–15.5)
WBC: 13.6 10*3/uL — ABNORMAL HIGH (ref 4.0–10.5)
nRBC: 0 % (ref 0.0–0.2)

## 2018-07-14 LAB — POC OCCULT BLOOD, ED: Fecal Occult Bld: NEGATIVE

## 2018-07-14 LAB — TYPE AND SCREEN
ABO/RH(D): O POS
Antibody Screen: NEGATIVE

## 2018-07-14 MED ORDER — ATORVASTATIN CALCIUM 10 MG PO TABS
10.0000 mg | ORAL_TABLET | Freq: Every day | ORAL | Status: DC
  Administered 2018-07-14 – 2018-07-15 (×2): 10 mg via ORAL
  Filled 2018-07-14 (×2): qty 1

## 2018-07-14 MED ORDER — RISAQUAD PO CAPS
1.0000 | ORAL_CAPSULE | Freq: Every day | ORAL | Status: DC
  Administered 2018-07-15 – 2018-07-16 (×2): 1 via ORAL
  Filled 2018-07-14 (×2): qty 1

## 2018-07-14 MED ORDER — ACETAMINOPHEN 325 MG PO TABS: 650.0000 mg | ORAL_TABLET | Freq: Four times a day (QID) | ORAL | Status: DC | PRN

## 2018-07-14 MED ORDER — METFORMIN HCL 500 MG PO TABS: 500.0000 mg | ORAL_TABLET | Freq: Two times a day (BID) | ORAL | Status: DC

## 2018-07-14 MED ORDER — ADULT MULTIVITAMIN W/MINERALS CH
1.0000 | ORAL_TABLET | Freq: Every morning | ORAL | Status: DC
  Administered 2018-07-15 – 2018-07-16 (×2): 1 via ORAL
  Filled 2018-07-14 (×2): qty 1

## 2018-07-14 MED ORDER — MEMANTINE HCL ER 28 MG PO CP24
28.0000 mg | ORAL_CAPSULE | Freq: Every day | ORAL | Status: DC
  Administered 2018-07-14 – 2018-07-15 (×2): 28 mg via ORAL
  Filled 2018-07-14 (×5): qty 1

## 2018-07-14 MED ORDER — DEXTROSE-NACL 5-0.45 % IV SOLN
INTRAVENOUS | Status: DC
  Administered 2018-07-14: 23:00:00 via INTRAVENOUS

## 2018-07-14 MED ORDER — METOPROLOL TARTRATE 25 MG PO TABS
12.5000 mg | ORAL_TABLET | Freq: Two times a day (BID) | ORAL | Status: DC
  Administered 2018-07-14 – 2018-07-15 (×3): 12.5 mg via ORAL
  Filled 2018-07-14 (×4): qty 1

## 2018-07-14 MED ORDER — ONDANSETRON HCL 4 MG/2ML IJ SOLN
4.0000 mg | Freq: Four times a day (QID) | INTRAMUSCULAR | Status: DC | PRN
  Administered 2018-07-14 – 2018-07-16 (×2): 4 mg via INTRAVENOUS
  Filled 2018-07-14 (×2): qty 2

## 2018-07-14 MED ORDER — PIPERACILLIN-TAZOBACTAM 3.375 G IVPB
3.3750 g | Freq: Three times a day (TID) | INTRAVENOUS | Status: DC
  Administered 2018-07-15 – 2018-07-16 (×4): 3.375 g via INTRAVENOUS
  Filled 2018-07-14 (×5): qty 50

## 2018-07-14 MED ORDER — PIPERACILLIN-TAZOBACTAM 3.375 G IVPB 30 MIN
3.3750 g | Freq: Once | INTRAVENOUS | Status: AC
  Administered 2018-07-14: 3.375 g via INTRAVENOUS
  Filled 2018-07-14: qty 50

## 2018-07-14 MED ORDER — TAMSULOSIN HCL 0.4 MG PO CAPS
0.4000 mg | ORAL_CAPSULE | Freq: Every day | ORAL | Status: DC
  Administered 2018-07-14 – 2018-07-15 (×2): 0.4 mg via ORAL
  Filled 2018-07-14 (×2): qty 1

## 2018-07-14 MED ORDER — ACETAMINOPHEN 650 MG RE SUPP
650.0000 mg | Freq: Four times a day (QID) | RECTAL | Status: DC | PRN
  Administered 2018-07-17: 650 mg via RECTAL
  Filled 2018-07-14: qty 1

## 2018-07-14 MED ORDER — ESCITALOPRAM OXALATE 10 MG PO TABS
15.0000 mg | ORAL_TABLET | Freq: Every day | ORAL | Status: DC
  Administered 2018-07-15 – 2018-07-16 (×2): 15 mg via ORAL
  Filled 2018-07-14 (×2): qty 2

## 2018-07-14 NOTE — Progress Notes (Signed)
Pharmacy Antibiotic Note  Juan Patel is a 84 y.o. male admitted on 07/16/2018 with aspiration pneumonia.  Pharmacy has been consulted for aspiration pneumonia dosing.  Plan: Zosyn 3.375g IV q8h (4 hour infusion).  Monitor labs, c/s, and patient improvement.     Temp (24hrs), Avg:97.7 F (36.5 C), Min:97.7 F (36.5 C), Max:97.7 F (36.5 C)  Recent Labs  Lab Jul 16, 2018 1427  WBC 13.6*  CREATININE 0.73    CrCl cannot be calculated (Unknown ideal weight.).    No Known Allergies  Antimicrobials this admission: Zosyn 10/9 >>      Dose adjustments this admission: N/A  Microbiology results: None pending  Thank you for allowing pharmacy to be a part of this patient's care.  Tad Moore July 16, 2018 8:10 PM

## 2018-07-14 NOTE — ED Provider Notes (Signed)
[facility] EMERGENCY DEPARTMENT Provider Note   CSN: [id number] Arrival date & time: 07/10/2018  1408     History   Chief Complaint Chief Complaint  Patient presents with  . Hemoptysis    HPI Juan Patel is a 84 y.o. male.  HPI Patient with a history of Alzheimer's.  Unable to contribute to history.  Level 5 caveat applies.  Brought him by EMS from nursing home.  Per nursing home staff patient had episode of vomiting after eating lunch.  States the vomit contained brown "old appearing" bloody.  Patient was also noted to be tachycardic. Past Medical History:  Diagnosis Date  . Abdominal aortic aneurysm (HCC)   . CAD (coronary artery disease)    S/P CABG   . Dementia (HCC)    beginning stages- on aricept  . Depression   . Diabetes mellitus    type 2.  . Dyslipidemia   . HTN (hypertension) 07/04/2011    Patient Active Problem List   Diagnosis Date Noted  . N&V (nausea and vomiting) 07/29/2018  . Aftercare following surgery of the circulatory system 03/25/2016  . Abdominal aortic aneurysm without rupture (HCC) 08/16/2014  . AAA (abdominal aortic aneurysm) without rupture (HCC) 08/01/2014  . Occlusion and stenosis of carotid artery without mention of cerebral infarction 02/14/2014  . Aftercare following surgery of the circulatory system, NEC 07/26/2013  . AAA (abdominal aortic aneurysm) (HCC) 08/17/2012  . Dementia (HCC) 10/17/2011  . Carotid stenosis 07/30/2011  . Confusion 07/04/2011  . Murmur 07/04/2011  . HTN (hypertension) 07/04/2011  . DYSLIPIDEMIA 10/23/2010  . CORONARY ATHEROSCLEROSIS NATIVE CORONARY ARTERY 10/23/2010  . ABDOMINAL AORTIC ANEURYSM 06/14/2009    Past Surgical History:  Procedure Laterality Date  . ABDOMINAL AORTIC ENDOVASCULAR STENT GRAFT N/A 08/16/2014   Procedure: ABDOMINAL AORTIC ENDOVASCULAR STENT GRAFT;  Surgeon: Larina Earthly, MD;  Location: [facility] OR;  Service: Vascular;  Laterality: N/A;  . APPENDECTOMY    . CORONARY ARTERY BYPASS GRAFT   Oct. 1993   RIMA to the RCA, LIMA to the LAD, SVG to diagonal, and SVG to circumflex        Home Medications    Prior to Admission medications   Medication Sig Start Date End Date Taking? Authorizing Provider  acetaminophen (TYLENOL 8 HOUR) 650 MG CR tablet Take 650 mg by mouth 3 (three) times daily.   Yes [provider]  aspirin 81 MG tablet Take 81 mg by mouth daily.    Yes [provider]  atorvastatin (LIPITOR) 10 MG tablet Take 10 mg by mouth at bedtime.    Yes [provider]  Bacillus Coagulans-Inulin (PROBIOTIC FORMULA PO) Take 1 tablet by mouth daily.   Yes [provider]  escitalopram (LEXAPRO) 20 MG tablet Take 15 mg by mouth daily.    Yes [provider]  Memantine HCl ER 28 MG CP24 Take 28 mg by mouth at bedtime.    Yes [provider]  metFORMIN (GLUCOPHAGE) 500 MG tablet Take 500 mg by mouth 2 (two) times daily with a meal.    Yes [provider]  metoprolol tartrate (LOPRESSOR) 25 MG tablet Take 12.5 mg by mouth 2 (two) times daily.    Yes [provider]  Multiple Vitamin (MULTIVITAMIN WITH MINERALS) TABS tablet Take 1 tablet by mouth every morning.    Yes [provider]  senna-docusate (SENOKOT-S) 8.6-50 MG tablet Take 1 tablet by mouth 2 (two) times daily.   Yes [provider]  tamsulosin [facility])  0.4 MG CAPS capsule Take 0.4 mg by mouth daily after supper.   Yes [provider]    Family History Family History  Problem Relation Age of Onset  . Heart disease Father        Heart Disease before age 73  . Deep vein thrombosis Father        Varicose Veins  . Cancer Sister   . Heart disease Sister   . Hypertension Sister   . Cancer Brother   . Deep vein thrombosis Mother        Varicose Veins  . Heart attack Mother   . Hypertension Sister   . Hypertension Daughter     Social History Social History   Tobacco Use  . Smoking status: Former Smoker     Packs/day: 0.50    Types: Cigarettes    Last attempt to quit: 10/26/1961    Years since quitting: 56.7  . Smokeless tobacco: Never Used  Substance Use Topics  . Alcohol use: Yes    Alcohol/week: 0.0 standard drinks    Comment: rarely  . Drug use: No     Allergies   Patient has no known allergies.   Review of Systems Review of Systems  Unable to perform ROS: Dementia     Physical Exam Updated Vital Signs BP 117/72   Pulse 88   Temp 97.7 F (36.5 C) (Oral)   Resp 15   SpO2 96%   Physical Exam  Constitutional: He appears well-developed and well-nourished. No distress.  HENT:  Head: Normocephalic and atraumatic.  Mouth/Throat: Oropharynx is clear and moist. No oropharyngeal exudate.  Eyes: Pupils are equal, round, and reactive to light. EOM are normal.  Neck: Normal range of motion. Neck supple. No JVD present.  Cardiovascular: Normal rate and regular rhythm. Exam reveals no gallop and no friction rub.  No murmur heard. Pulmonary/Chest: Effort normal and breath sounds normal. No stridor. No respiratory distress. He has no wheezes. He has no rales. He exhibits no tenderness.  Abdominal: Soft. Bowel sounds are normal. There is no tenderness. There is no rebound and no guarding.  Genitourinary:  Genitourinary Comments: Brown stool in rectum.  Musculoskeletal: Normal range of motion. He exhibits no edema or tenderness.  No lower extremity swelling, asymmetry or tenderness.  Lymphadenopathy:    He has no cervical adenopathy.  Neurological: He is alert.  Follows simple commands.  Moving all extremities without focal deficit.  Sensation grossly intact.  Skin: Skin is warm and dry. Capillary refill takes less than 2 seconds. No rash noted. He is not diaphoretic. No erythema.  Psychiatric: He has a normal mood and affect. His behavior is normal.  Nursing note and vitals reviewed.    ED Treatments / Results  Labs (all labs ordered are listed, but only abnormal results are  displayed) Labs Reviewed  COMPREHENSIVE METABOLIC PANEL - Abnormal; Notable for the following components:      Result Value   Glucose, Bld 135 (*)    Albumin 3.2 (*)    All other components within normal limits  CBC WITH DIFFERENTIAL/PLATELET - Abnormal; Notable for the following components:   WBC 13.6 (*)    RBC 3.73 (*)    Hemoglobin 12.0 (*)    HCT 38.2 (*)    MCV 102.4 (*)    Neutro Abs 8.5 (*)    Eosinophils Absolute 0.7 (*)    Abs Immature Granulocytes 0.08 (*)    All other components within normal limits  POC OCCULT BLOOD,  ED  TYPE AND SCREEN    EKG EKG Interpretation  Date/Time:  Wednesday July 14 2018 14:34:42 EDT Ventricular Rate:  82 PR Interval:    QRS Duration: 96 QT Interval:  378 QTC Calculation: 442 R Axis:   -56 Text Interpretation:  Atrial fibrillation Ventricular premature complex Abnormal R-wave progression, late transition Inferior infarct, old Confirmed by Loren Racer ([id number]) on 07/13/2018 6:36:28 PM   Radiology Dg Chest Port 1 View  Result Date: 07/28/2018 CLINICAL DATA:  Hemoptysis. EXAM: PORTABLE CHEST 1 VIEW COMPARISON:  10/03/2016 FINDINGS: Low lung volumes with patchy left basilar airspace opacities suspicious for pneumonia and atelectasis. No overt pulmonary edema. No effusion or pneumothorax. Status post CABG. Moderate aortic atherosclerosis at the arch without aneurysmal dilatation is noted. Slight elevation of right hemidiaphragm. No acute osseous appearing abnormality. IMPRESSION: Patchy consolidations at the left lung base suspicious for pneumonia and/or atelectasis. Aortic atherosclerosis without aneurysmal dilatation. Electronically Signed   By: Tollie Eth M.D.   On: 08/01/2018 15:40    Procedures Procedures (including critical care time)  Medications Ordered in ED Medications  piperacillin-tazobactam (ZOSYN) IVPB 3.375 g (3.375 g Intravenous New Bag/Given 07/10/2018 1824)     Initial Impression / Assessment and Plan / ED  Course  I have reviewed the triage vital signs and the nursing notes.  Pertinent labs & imaging results that were available during my care of the patient were reviewed by me and considered in my medical decision making (see chart for details).     Stable blood pressure though oxygen saturation drops into the low 90s.  Appears to have infiltrate consistent with pneumonia on x-ray.  Hemoglobin is stable.  No further vomiting.  Negative occult blood stool.  Discussed with hospitalist who will observe overnight in the emergency department.  Final Clinical Impressions(s) / ED Diagnoses   Final diagnoses:  Non-intractable vomiting with nausea, unspecified vomiting type  Aspiration pneumonia, unspecified aspiration pneumonia type, unspecified laterality, unspecified part of lung [facility])    ED Discharge Orders    None       Loren Racer, MD 07/09/2018 1836

## 2018-07-14 NOTE — ED Notes (Signed)
Report given to Tandra 300 RN.

## 2018-07-14 NOTE — ED Notes (Signed)
PLEASE CALL PATSY Fini AT [phone] OF PATIENT STATUS. SHE STATED ESPECIALLY IF HE IS DISCHARGED AS SHE WANTS TO BE AT NURSING HOME WHEN HE ARRIVES DUE TO HIS CONFUSION.

## 2018-07-14 NOTE — H&P (Signed)
TRH H&P   Patient Demographics:    Juan Patel, is a 84 y.o. male  MRN: [id number]   DOB - 1925/12/09  Admit Date - 08/05/2018  Outpatient Primary MD for the patient is Joette Catching, MD  Referring MD/NP/PA:  Loren Racer  Outpatient Specialists:    Patient coming from: [facility]  Chief Complaint  Patient presents with  . Hemoptysis      HPI:    Juan Patel  is a 84 y.o. male, w Dm2, hypertension, hyperlipidemia,  CAD S/p CABG, AAA who presents with apparently n/v with ? Blood clot while at SNF.    In Ed,  T 97.7, P 88  Bp 117/72 pox 96%  Wbc 13.6, Hgb 12.0, Plt 238 Mcv 102.4 rdw 13.2  Na 140, K 4.4 Bun 21, Creatinine 0.73 Alb 3.2 Ast 17, Alt 16 FOBT negative  CXR IMPRESSION: Patchy consolidations at the left lung base suspicious for pneumonia and/or atelectasis.  Aortic atherosclerosis without aneurysmal dilatation.  Type and screen performed by ED, pt received zosyn iv x1 for ? Aspiration pneumonia.        Review of systems:    In addition to the HPI above,  No Fever-chills, No Headache, No changes with Vision or hearing, No problems swallowing food or Liquids, No Chest pain, Cough or Shortness of Breath, No Abdominal pain,  Bowel movements are regular, No Blood in stool or Urine, No dysuria, No new skin rashes or bruises, No new joints pains-aches,  No new weakness, tingling, numbness in any extremity, No recent weight gain or loss, No polyuria, polydypsia or polyphagia, No significant Mental Stressors.  A full 10 point Review of Systems was done, except as stated above, all other Review of Systems were negative.   With Past History of the following :    Past Medical History:  Diagnosis Date  . Abdominal aortic aneurysm (HCC)   . CAD (coronary artery disease)    S/P CABG   . Dementia (HCC)    beginning stages- on  aricept  . Depression   . Diabetes mellitus    type 2.  . Dyslipidemia   . HTN (hypertension) 07/04/2011      Past Surgical History:  Procedure Laterality Date  . ABDOMINAL AORTIC ENDOVASCULAR STENT GRAFT N/A 08/16/2014   Procedure: ABDOMINAL AORTIC ENDOVASCULAR STENT GRAFT;  Surgeon: Larina Earthly, MD;  Location: [facility] OR;  Service: Vascular;  Laterality: N/A;  . APPENDECTOMY    . CORONARY ARTERY BYPASS GRAFT  Oct. 1993   RIMA to the RCA, LIMA to the LAD, SVG to diagonal, and SVG to circumflex      Social History:     Social History   Tobacco Use  . Smoking status: Former Smoker    Packs/day: 0.50    Types: Cigarettes    Last attempt to quit: 10/26/1961    Years since quitting:  56.7  . Smokeless tobacco: Never Used  Substance Use Topics  . Alcohol use: Yes    Alcohol/week: 0.0 standard drinks    Comment: rarely     Lives - at home  Mobility - walks by self   Family History :     Family History  Problem Relation Age of Onset  . Heart disease Father        Heart Disease before age 62  . Deep vein thrombosis Father        Varicose Veins  . Cancer Sister   . Heart disease Sister   . Hypertension Sister   . Cancer Brother   . Deep vein thrombosis Mother        Varicose Veins  . Heart attack Mother   . Hypertension Sister   . Hypertension Daughter       Home Medications:   Prior to Admission medications   Medication Sig Start Date End Date Taking? Authorizing Provider  acetaminophen (TYLENOL 8 HOUR) 650 MG CR tablet Take 650 mg by mouth 3 (three) times daily.   Yes [provider]  aspirin 81 MG tablet Take 81 mg by mouth daily.    Yes [provider]  atorvastatin (LIPITOR) 10 MG tablet Take 10 mg by mouth at bedtime.    Yes [provider]  Bacillus Coagulans-Inulin (PROBIOTIC FORMULA PO) Take 1 tablet by mouth daily.   Yes [provider]  escitalopram (LEXAPRO) 20 MG tablet Take 15 mg by mouth daily.    Yes [provider]  Memantine HCl ER 28 MG CP24 Take 28 mg by mouth at bedtime.    Yes [provider]  metFORMIN (GLUCOPHAGE) 500 MG tablet Take 500 mg by mouth 2 (two) times daily with a meal.    Yes [provider]  metoprolol tartrate (LOPRESSOR) 25 MG tablet Take 12.5 mg by mouth 2 (two) times daily.    Yes [provider]  Multiple Vitamin (MULTIVITAMIN WITH MINERALS) TABS tablet Take 1 tablet by mouth every morning.    Yes [provider]  senna-docusate (SENOKOT-S) 8.6-50 MG tablet Take 1 tablet by mouth 2 (two) times daily.   Yes [provider]  tamsulosin (FLOMAX) 0.4 MG CAPS capsule Take 0.4 mg by mouth daily after supper.   Yes [provider]     Allergies:    No Known Allergies   Physical Exam:   Vitals  Blood pressure 117/72, pulse 88, temperature 97.7 F (36.5 C), temperature source Oral, resp. rate 15, SpO2 96 %.   1. General  lying in bed in NAD,    2. Normal affect and insight, Not Suicidal or Homicidal, Awake Alert, Oriented X 1.  3. No F.N deficits, ALL C.Nerves Intact, Strength 5/5 all 4 extremities, Sensation intact all 4 extremities, Plantars down going.  4. Ears and Eyes appear Normal, Conjunctivae clear, PERRLA. Moist Oral Mucosa.  5. Supple Neck, No JVD, No cervical lymphadenopathy appriciated, No Carotid Bruits.  6. Symmetrical Chest wall movement, slight crackles left lung base, no wheezing.   7. RRR, No Gallops, Rubs or Murmurs, No Parasternal Heave.  8. Positive Bowel Sounds, Abdomen Soft, No tenderness, No organomegaly appriciated,No rebound -guarding or rigidity.  9.  No Cyanosis, Normal Skin Turgor, No Skin Rash or Bruise.  10. Good muscle tone,  joints appear normal , no effusions, Normal ROM.  11. No Palpable Lymph Nodes in Neck or Axillae      Data Review:  CBC Recent Labs  Lab 07/26/2018 1427  WBC 13.6*  HGB 12.0*  HCT 38.2*  PLT 238  MCV 102.4*  MCH 32.2  MCHC 31.4  RDW  13.2  LYMPHSABS 3.5  MONOABS 0.8  EOSABS 0.7*  BASOSABS 0.1   ------------------------------------------------------------------------------------------------------------------  Chemistries  Recent Labs  Lab 07/29/2018 1427  NA 140  K 4.4  CL 104  CO2 28  GLUCOSE 135*  BUN 21  CREATININE 0.73  CALCIUM 9.0  AST 17  ALT 16  ALKPHOS 56  BILITOT 0.4   ------------------------------------------------------------------------------------------------------------------ CrCl cannot be calculated (Unknown ideal weight.). ------------------------------------------------------------------------------------------------------------------ No results for input(s): TSH, T4TOTAL, T3FREE, THYROIDAB in the last 72 hours.  Invalid input(s): FREET3  Coagulation profile No results for input(s): INR, PROTIME in the last 168 hours. ------------------------------------------------------------------------------------------------------------------- No results for input(s): DDIMER in the last 72 hours. -------------------------------------------------------------------------------------------------------------------  Cardiac Enzymes No results for input(s): CKMB, TROPONINI, MYOGLOBIN in the last 168 hours.  Invalid input(s): CK ------------------------------------------------------------------------------------------------------------------ No results found for: BNP   ---------------------------------------------------------------------------------------------------------------  Urinalysis    Component Value Date/Time   COLORURINE YELLOW 08/14/2014 1433   APPEARANCEUR CLOUDY (A) 08/14/2014 1433   LABSPEC 1.026 08/14/2014 1433   PHURINE 5.5 08/14/2014 1433   GLUCOSEU 500 (A) 08/14/2014 1433   HGBUR NEGATIVE 08/14/2014 1433   BILIRUBINUR NEGATIVE 08/14/2014 1433   KETONESUR NEGATIVE 08/14/2014 1433   PROTEINUR NEGATIVE 08/14/2014 1433   UROBILINOGEN 0.2 08/14/2014 1433   NITRITE NEGATIVE  08/14/2014 1433   LEUKOCYTESUR NEGATIVE 08/14/2014 1433    ----------------------------------------------------------------------------------------------------------------   Imaging Results:    Dg Chest Port 1 View  Result Date: 07/13/2018 CLINICAL DATA:  Hemoptysis. EXAM: PORTABLE CHEST 1 VIEW COMPARISON:  10/03/2016 FINDINGS: Low lung volumes with patchy left basilar airspace opacities suspicious for pneumonia and atelectasis. No overt pulmonary edema. No effusion or pneumothorax. Status post CABG. Moderate aortic atherosclerosis at the arch without aneurysmal dilatation is noted. Slight elevation of right hemidiaphragm. No acute osseous appearing abnormality. IMPRESSION: Patchy consolidations at the left lung base suspicious for pneumonia and/or atelectasis. Aortic atherosclerosis without aneurysmal dilatation. Electronically Signed   By: Tollie Eth M.D.   On: 08/04/2018 15:40   Afib at 82, LAD , early R progression    Assessment & Plan:    Principal Problem:   N&V (nausea and vomiting)    N/v, ? Hematemesis  protonix 80mg  iv x1 then 8mg / hour NPO Serial CBC GI consultation  ? Aspiration pneumonia Blood culture x2 Urine strep antigen Urine legionella antigen Zosyn iv pharmacy to dose  Anemia Check cbc as above  CAD s/p CABG Hold aspirin Cont Lipitor 10mg  po qhs Cont Metoprolol 12.29m gpo bid  Dm2 fsbs ac and qhs, ISS Cont Metformin   Dementia Cont Namenda XR 28mg  po qday  BPH Cont Flomax 0.4mg  po qhs     DVT Prophylaxis  SCDs  AM Labs Ordered, also please review Full Orders  Family Communication: Admission, patients condition and plan of care including tests being ordered have been discussed with the patient and daughter  who indicate  understanding and agree with the plan and Code Status.  Code Status DNR on yellow form  Likely DC to  SNF  Condition GUARDED   Consults called: GI consult placed in computer  Admission status: observation  Time  spent in minutes : 70   Pearson Grippe M.D on 07/23/2018 at 6:57 PM  Between 7am to 7pm - Pager - [phone]  . After 7pm go to [email or web address] - password [email or web address]  [facility] -  Office  [phone]

## 2018-07-14 NOTE — ED Triage Notes (Addendum)
Pt from [facility] in [place]. Alzheimers. Has been vomiting blood today. Pt mildly pale. Alert. Grimacing. cbg 119

## 2018-07-15 ENCOUNTER — Encounter (HOSPITAL_COMMUNITY): Admission: EM | Disposition: E | Payer: Self-pay | Source: Home / Self Care | Attending: Internal Medicine

## 2018-07-15 ENCOUNTER — Encounter (HOSPITAL_COMMUNITY): Payer: Self-pay | Admitting: *Deleted

## 2018-07-15 ENCOUNTER — Other Ambulatory Visit: Payer: Self-pay

## 2018-07-15 ENCOUNTER — Inpatient Hospital Stay (HOSPITAL_COMMUNITY): Payer: Medicare Other | Admitting: Anesthesiology

## 2018-07-15 DIAGNOSIS — Z87891 Personal history of nicotine dependence: Secondary | ICD-10-CM | POA: Diagnosis not present

## 2018-07-15 DIAGNOSIS — Z515 Encounter for palliative care: Secondary | ICD-10-CM | POA: Diagnosis not present

## 2018-07-15 DIAGNOSIS — R112 Nausea with vomiting, unspecified: Secondary | ICD-10-CM | POA: Diagnosis not present

## 2018-07-15 DIAGNOSIS — D649 Anemia, unspecified: Secondary | ICD-10-CM

## 2018-07-15 DIAGNOSIS — Z7189 Other specified counseling: Secondary | ICD-10-CM | POA: Diagnosis not present

## 2018-07-15 DIAGNOSIS — Z66 Do not resuscitate: Secondary | ICD-10-CM | POA: Diagnosis present

## 2018-07-15 DIAGNOSIS — I10 Essential (primary) hypertension: Secondary | ICD-10-CM | POA: Diagnosis present

## 2018-07-15 DIAGNOSIS — Z7982 Long term (current) use of aspirin: Secondary | ICD-10-CM | POA: Diagnosis not present

## 2018-07-15 DIAGNOSIS — Z951 Presence of aortocoronary bypass graft: Secondary | ICD-10-CM | POA: Diagnosis not present

## 2018-07-15 DIAGNOSIS — N400 Enlarged prostate without lower urinary tract symptoms: Secondary | ICD-10-CM | POA: Diagnosis present

## 2018-07-15 DIAGNOSIS — Z8249 Family history of ischemic heart disease and other diseases of the circulatory system: Secondary | ICD-10-CM | POA: Diagnosis not present

## 2018-07-15 DIAGNOSIS — D62 Acute posthemorrhagic anemia: Secondary | ICD-10-CM | POA: Diagnosis present

## 2018-07-15 DIAGNOSIS — Z7984 Long term (current) use of oral hypoglycemic drugs: Secondary | ICD-10-CM | POA: Diagnosis not present

## 2018-07-15 DIAGNOSIS — E785 Hyperlipidemia, unspecified: Secondary | ICD-10-CM | POA: Diagnosis present

## 2018-07-15 DIAGNOSIS — J690 Pneumonitis due to inhalation of food and vomit: Secondary | ICD-10-CM | POA: Diagnosis not present

## 2018-07-15 DIAGNOSIS — G309 Alzheimer's disease, unspecified: Secondary | ICD-10-CM | POA: Diagnosis present

## 2018-07-15 DIAGNOSIS — I2510 Atherosclerotic heart disease of native coronary artery without angina pectoris: Secondary | ICD-10-CM | POA: Diagnosis present

## 2018-07-15 DIAGNOSIS — E119 Type 2 diabetes mellitus without complications: Secondary | ICD-10-CM | POA: Diagnosis present

## 2018-07-15 DIAGNOSIS — I480 Paroxysmal atrial fibrillation: Secondary | ICD-10-CM | POA: Diagnosis present

## 2018-07-15 DIAGNOSIS — K920 Hematemesis: Secondary | ICD-10-CM

## 2018-07-15 DIAGNOSIS — Z79899 Other long term (current) drug therapy: Secondary | ICD-10-CM | POA: Diagnosis not present

## 2018-07-15 DIAGNOSIS — F0280 Dementia in other diseases classified elsewhere without behavioral disturbance: Secondary | ICD-10-CM | POA: Diagnosis present

## 2018-07-15 LAB — CBC
HEMATOCRIT: 31.4 % — AB (ref 39.0–52.0)
HEMATOCRIT: 36.1 % — AB (ref 39.0–52.0)
HEMOGLOBIN: 9.9 g/dL — AB (ref 13.0–17.0)
Hemoglobin: 11.1 g/dL — ABNORMAL LOW (ref 13.0–17.0)
MCH: 33.1 pg (ref 26.0–34.0)
MCH: 31.8 pg (ref 26.0–34.0)
MCHC: 30.7 g/dL (ref 30.0–36.0)
MCHC: 31.5 g/dL (ref 30.0–36.0)
MCV: 103.4 fL — AB (ref 80.0–100.0)
MCV: 105 fL — AB (ref 80.0–100.0)
PLATELETS: 242 10*3/uL (ref 150–400)
Platelets: 228 10*3/uL (ref 150–400)
RBC: 2.99 MIL/uL — ABNORMAL LOW (ref 4.22–5.81)
RBC: 3.49 MIL/uL — ABNORMAL LOW (ref 4.22–5.81)
RDW: 13.2 % (ref 11.5–15.5)
RDW: 13.3 % (ref 11.5–15.5)
WBC: 10.4 10*3/uL (ref 4.0–10.5)
WBC: 18.4 10*3/uL — ABNORMAL HIGH (ref 4.0–10.5)
nRBC: 0 % (ref 0.0–0.2)
nRBC: 0 % (ref 0.0–0.2)

## 2018-07-15 LAB — COMPREHENSIVE METABOLIC PANEL
ALBUMIN: 2.6 g/dL — AB (ref 3.5–5.0)
ALT: 13 U/L (ref 0–44)
AST: 14 U/L — AB (ref 15–41)
Alkaline Phosphatase: 43 U/L (ref 38–126)
Anion gap: 6 (ref 5–15)
BUN: 42 mg/dL — AB (ref 8–23)
CO2: 28 mmol/L (ref 22–32)
CREATININE: 0.8 mg/dL (ref 0.61–1.24)
Calcium: 8.3 mg/dL — ABNORMAL LOW (ref 8.9–10.3)
Chloride: 107 mmol/L (ref 98–111)
GFR calc Af Amer: >60 mL/min (ref >60)
GFR calc non Af Amer: >60 mL/min (ref >60)
GLUCOSE: 145 mg/dL — AB (ref 70–99)
Potassium: 5.2 mmol/L — ABNORMAL HIGH (ref 3.5–5.1)
SODIUM: 141 mmol/L (ref 135–145)
Total Bilirubin: 0.5 mg/dL (ref 0.3–1.2)
Total Protein: 5.6 g/dL — ABNORMAL LOW (ref 6.5–8.1)

## 2018-07-15 LAB — GLUCOSE, CAPILLARY
GLUCOSE-CAPILLARY: 165 mg/dL — AB (ref 70–99)
GLUCOSE-CAPILLARY: 135 mg/dL — AB (ref 70–99)
GLUCOSE-CAPILLARY: 122 mg/dL — AB (ref 70–99)
Glucose-Capillary: 146 mg/dL — ABNORMAL HIGH (ref 70–99)
Glucose-Capillary: 135 mg/dL — ABNORMAL HIGH (ref 70–99)

## 2018-07-15 LAB — MAGNESIUM: MAGNESIUM: 2 mg/dL (ref 1.7–2.4)

## 2018-07-15 LAB — MRSA PCR SCREENING: MRSA BY PCR: POSITIVE — AB

## 2018-07-15 SURGERY — ESOPHAGOGASTRODUODENOSCOPY (EGD) WITH PROPOFOL
Anesthesia: Choice

## 2018-07-15 MED ORDER — PANTOPRAZOLE SODIUM 40 MG PO TBEC
40.0000 mg | DELAYED_RELEASE_TABLET | Freq: Two times a day (BID) | ORAL | Status: DC
  Administered 2018-07-15 – 2018-07-16 (×3): 40 mg via ORAL
  Filled 2018-07-15 (×3): qty 1

## 2018-07-15 MED ORDER — CHLORHEXIDINE GLUCONATE CLOTH 2 % EX PADS
6.0000 | MEDICATED_PAD | Freq: Every day | CUTANEOUS | Status: DC
  Administered 2018-07-15 – 2018-07-16 (×2): 6 via TOPICAL

## 2018-07-15 MED ORDER — MUPIROCIN 2 % EX OINT
1.0000 "application " | TOPICAL_OINTMENT | Freq: Two times a day (BID) | CUTANEOUS | Status: DC
  Administered 2018-07-15 – 2018-07-16 (×3): 1 via NASAL
  Filled 2018-07-15: qty 22

## 2018-07-15 MED ORDER — INSULIN ASPART 100 UNIT/ML ~~LOC~~ SOLN
0.0000 [IU] | SUBCUTANEOUS | Status: DC
  Administered 2018-07-15: 2 [IU] via SUBCUTANEOUS
  Administered 2018-07-15 – 2018-07-16 (×5): 1 [IU] via SUBCUTANEOUS

## 2018-07-15 MED ORDER — METOPROLOL TARTRATE 5 MG/5ML IV SOLN
INTRAVENOUS | Status: AC
  Administered 2018-07-15: 5 mg via INTRAVENOUS
  Filled 2018-07-15: qty 5

## 2018-07-15 MED ORDER — METOPROLOL TARTRATE 5 MG/5ML IV SOLN
5.0000 mg | Freq: Once | INTRAVENOUS | Status: AC
  Administered 2018-07-15: 5 mg via INTRAVENOUS

## 2018-07-15 MED ORDER — SODIUM CHLORIDE 0.9 % IV BOLUS
1000.0000 mL | Freq: Once | INTRAVENOUS | Status: AC
  Administered 2018-07-15: 1000 mL via INTRAVENOUS

## 2018-07-15 MED ORDER — PANTOPRAZOLE SODIUM 40 MG PO TBEC: 40.0000 mg | DELAYED_RELEASE_TABLET | Freq: Two times a day (BID) | ORAL | Status: DC

## 2018-07-15 MED ORDER — SODIUM CHLORIDE 0.45 % IV SOLN
INTRAVENOUS | Status: DC
  Administered 2018-07-15 – 2018-07-16 (×3): via INTRAVENOUS

## 2018-07-15 MED ORDER — MAGNESIUM SULFATE 2 GM/50ML IV SOLN
2.0000 g | Freq: Once | INTRAVENOUS | Status: AC
  Administered 2018-07-15: 2 g via INTRAVENOUS
  Filled 2018-07-15: qty 50

## 2018-07-15 NOTE — Progress Notes (Signed)
Report called to [place] Foley. Patient transported back to floor via stretcher.

## 2018-07-15 NOTE — Progress Notes (Signed)
Consent signed via spouse due to patient's diagnosis of dementia. Placed in chart.

## 2018-07-15 NOTE — Clinical Social Work Note (Signed)
Clinical Social Work Assessment  Patient Details  Name: Juan Patel MRN: [id number] Date of Birth: [birth year]  Date of referral:  07/17/2018               Reason for consult:  Other (Comment Required)(From [facility] )                Permission sought to share information with:    Permission granted to share information::     Name::        Agency::  Erich Montane, with [facility]   Relationship::     Contact Information:     Housing/Transportation Living arrangements for the past 2 months:  Skilled Building surveyor of Information:  Facility Patient Interpreter Needed:  None Criminal Activity/Legal Involvement Pertinent to Current Situation/Hospitalization:  No - Comment as needed Significant Relationships:  Adult Children Lives with:  Facility Resident Do you feel safe going back to the place where you live?  Yes Need for family participation in patient care:  No (Coment)  Care giving concerns:  Patient is maximum assistance for ADLs at the facility.   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 He is mainly bed bound. He has been a resident at the facility since Mar 03, 2018. Facility will accept patient back at discharge. He will need EMS transport.   Employment status:  Retired Health and safety inspector:  Medicare PT Recommendations:  Not assessed at this time Information / Referral to community resources:     Patient/Family's Response to care:  Patient is a Engineer, materials pay resident. LCSW to speak with family regarding discharge plan.   Patient/Family's Understanding of and Emotional Response to Diagnosis, Current Treatment, and Prognosis:  LCSW will explore understanding of patient's current diagnosis, treatment and prognosis.   Emotional Assessment Appearance:  Appears stated age Attitude/Demeanor/Rapport:    Affect (typically observed):  Calm Orientation:  Oriented to Self Alcohol / Substance use:  Not Applicable Psych  involvement (Current and /or in the community):     Discharge Needs  Concerns to be addressed:  Discharge Planning Concerns Readmission within the last 30 days:  No Current discharge risk:  None Barriers to Discharge:  No Barriers Identified   Annice Needy, LCSW 07/13/2018, 11:37 AM

## 2018-07-15 NOTE — Consult Note (Addendum)
Referring Provider: Erick Blinks, DO Primary Care Physician:  Joette Catching, MD Primary Gastroenterologist:  Roetta Sessions, MD (remotely seen by Dr. Matthias Hughs in 2006.   Reason for Consultation:  hematemesis  HPI: Juan Patel is a 84 y.o. male with history of diabetes mellitus, hypertension, CAD status post CABG, AAA who presents from skilled nursing facility with reported nausea/vomiting after lunch containing brown old appearing blood, questionable blood clot.  X-ray in the ER showed patchy consolidations at the left lung base suspicious for pneumonia and/or atelectasis.  Patient was started on Zosyn, questionable aspiration pneumonia.  In the ED's on rectal exam, stool was brown, heme-negative.  Hemoglobin 12, white blood cell count 13,600, MCV 102.4, platelets normal.  Hemoglobin 3 years ago was 12.2.  Hemoglobin this morning 9.9, MCV 105, white blood cell count 10,400, platelets normal.  Potassium 5.2, BUN 42, creatinine 0.8.  Patient unable to provide history. I called and spoke to patient's wife Therapist, music). Yesterday at lunch, patient had several episodes of vomiting, became very dark and then noted blood in the emesis and what was described as blood clot.   Patient underwent swallowing evaluation with speech therapy last year.  He had some mild oropharyngeal dysphasia, recommended diet D2/mechanical soft ground with thin liquids via cup or teaspoon presentation but no straws due to risk of aspiration.   Prior to Admission medications   Medication Sig Start Date End Date Taking? Authorizing Provider  acetaminophen (TYLENOL 8 HOUR) 650 MG CR tablet Take 650 mg by mouth 3 (three) times daily.   Yes [provider]  aspirin 81 MG tablet Take 81 mg by mouth daily.    Yes [provider]  atorvastatin (LIPITOR) 10 MG tablet Take 10 mg by mouth at bedtime.    Yes [provider]  Bacillus Coagulans-Inulin (PROBIOTIC FORMULA PO) Take 1 tablet by mouth daily.   Yes  [provider]  escitalopram (LEXAPRO) 20 MG tablet Take 15 mg by mouth daily.    Yes [provider]  Memantine HCl ER 28 MG CP24 Take 28 mg by mouth at bedtime.    Yes [provider]  metFORMIN (GLUCOPHAGE) 500 MG tablet Take 500 mg by mouth 2 (two) times daily with a meal.    Yes [provider]  metoprolol tartrate (LOPRESSOR) 25 MG tablet Take 12.5 mg by mouth 2 (two) times daily.    Yes [provider]  Multiple Vitamin (MULTIVITAMIN WITH MINERALS) TABS tablet Take 1 tablet by mouth every morning.    Yes [provider]  senna-docusate (SENOKOT-S) 8.6-50 MG tablet Take 1 tablet by mouth 2 (two) times daily.   Yes [provider]  tamsulosin (FLOMAX) 0.4 MG CAPS capsule Take 0.4 mg by mouth daily after supper.   Yes [provider]    Current Facility-Administered Medications  Medication Dose Route Frequency Provider Last Rate Last Dose  . acetaminophen (TYLENOL) tablet 650 mg  650 mg Oral Q6H PRN Pearson Grippe, MD       Or  . acetaminophen (TYLENOL) suppository 650 mg  650 mg Rectal Q6H PRN Pearson Grippe, MD      . acidophilus (RISAQUAD) capsule 1 capsule  1 capsule Oral Daily Pearson Grippe, MD      . atorvastatin (LIPITOR) tablet 10 mg  10 mg Oral Farrel Demark, MD   10 mg at 07/13/2018 2238  . escitalopram (LEXAPRO) tablet 15 mg  15 mg Oral Daily Pearson Grippe, MD      .  memantine (NAMENDA XR) 24 hr capsule 28 mg  28 mg Oral QHS Pearson Grippe, MD   28 mg at 07/20/2018 2245  . metoprolol tartrate (LOPRESSOR) tablet 12.5 mg  12.5 mg Oral BID Pearson Grippe, MD   12.5 mg at 07/27/2018 2238  . multivitamin with minerals tablet 1 tablet  1 tablet Oral q morning - 10a Pearson Grippe, MD      . ondansetron [facility]) injection 4 mg  4 mg Intravenous Q6H PRN Bobette Mo, MD   4 mg at 07/21/2018 2351  . piperacillin-tazobactam (ZOSYN) IVPB 3.375 g  3.375 g Intravenous Q8H Pearson Grippe, MD   Stopped at 2018/07/23 0600  . tamsulosin (FLOMAX) capsule  0.4 mg  0.4 mg Oral QPC supper Pearson Grippe, MD   0.4 mg at 07/18/2018 2238    Allergies as of 07/13/2018  . (No Known Allergies)    Past Medical History:  Diagnosis Date  . Abdominal aortic aneurysm (HCC)   . CAD (coronary artery disease)    S/P CABG   . Dementia (HCC)    beginning stages- on aricept  . Depression   . Diabetes mellitus    type 2.  . Dyslipidemia   . HTN (hypertension) 07/04/2011    Past Surgical History:  Procedure Laterality Date  . ABDOMINAL AORTIC ENDOVASCULAR STENT GRAFT N/A 08/16/2014   Procedure: ABDOMINAL AORTIC ENDOVASCULAR STENT GRAFT;  Surgeon: Larina Earthly, MD;  Location: [facility] OR;  Service: Vascular;  Laterality: N/A;  . APPENDECTOMY    . CORONARY ARTERY BYPASS GRAFT  Oct. 1993   RIMA to the RCA, LIMA to the LAD, SVG to diagonal, and SVG to circumflex    Family History  Problem Relation Age of Onset  . Heart disease Father        Heart Disease before age 42  . Deep vein thrombosis Father        Varicose Veins  . Cancer Sister   . Heart disease Sister   . Hypertension Sister   . Cancer Brother   . Deep vein thrombosis Mother        Varicose Veins  . Heart attack Mother   . Hypertension Sister   . Hypertension Daughter     Social History   Socioeconomic History  . Marital status: Married    Spouse name: Not on file  . Number of children: Not on file  . Years of education: Not on file  . Highest education level: Not on file  Occupational History  . Not on file  Social Needs  . Financial resource strain: Not on file  . Food insecurity:    Worry: Not on file    Inability: Not on file  . Transportation needs:    Medical: Not on file    Non-medical: Not on file  Tobacco Use  . Smoking status: Former Smoker    Packs/day: 0.50    Types: Cigarettes    Last attempt to quit: 10/26/1961    Years since quitting: 56.7  . Smokeless tobacco: Never Used  Substance and Sexual Activity  . Alcohol use: Yes    Alcohol/week: 0.0 standard drinks     Comment: rarely  . Drug use: No  . Sexual activity: Not on file  Lifestyle  . Physical activity:    Days per week: Not on file    Minutes per session: Not on file  . Stress: Not on file  Relationships  . Social connections:    Talks on phone: Not on  file    Gets together: Not on file    Attends religious service: Not on file    Active member of club or organization: Not on file    Attends meetings of clubs or organizations: Not on file    Relationship status: Not on file  . Intimate partner violence:    Fear of current or ex partner: Not on file    Emotionally abused: Not on file    Physically abused: Not on file    Forced sexual activity: Not on file  Other Topics Concern  . Not on file  Social History Narrative  . Not on file     ROS: Unable to obtain from patient  Physical Examination: Vital signs in last 24 hours: Temp:  [97.7 F (36.5 C)-99.8 F (37.7 C)] 98.6 F (37 C) (10/10 0614) Pulse Rate:  [80-153] 85 (10/10 0614) Resp:  [14-33] 14 (10/10 0614) BP: (86-126)/(55-82) 98/55 (10/10 0614) SpO2:  [91 %-99 %] 92 % (10/10 0614) Weight:  [70.5 kg-71.1 kg] 71.1 kg (10/10 0451)    General: elderly WF, resting comfortably. Asleep. Arouses. Does not communicate verbally with me. No acute distress.  Head: Normocephalic, atraumatic.   Eyes: Conjunctiva pink, no icterus. Mouth: Oropharyngeal mucosa with dried brown material on the lips.  Neck: Supple without thyromegaly, masses, or lymphadenopathy.  Lungs: diminished breath sounds in bases.  Heart: Regular rate and rhythm, no murmurs rubs or gallops.  Abdomen: Bowel sounds are normal, nondistended, no hepatosplenomegaly or masses, no abdominal bruits or    hernia , with palpation patient becomes agitated but seemed to calm down with distraction, no objective guarding.   Rectal: not performed Extremities: No lower extremity edema, clubbing, deformity.  Neuro: Alert Skin: Warm and dry, no rash or jaundice.   Psych:  Alert        Intake/Output from previous day: 10/09 0701 - 10/10 0700 In: 390.8 [I.V.:234; IV Piggyback:156.8] Out: -  Intake/Output this shift: No intake/output data recorded.  Lab Results: CBC Recent Labs    July 29, 2018 1427 2018-07-29 2347 07/30/2018 0530  WBC 13.6* 18.4* 10.4  HGB 12.0* 11.1* 9.9*  HCT 38.2* 36.1* 31.4*  MCV 102.4* 103.4* 105.0*  PLT 238 242 228   BMET Recent Labs    2018/07/29 1427 07/10/2018 0530  NA 140 141  K 4.4 5.2*  CL 104 107  CO2 28 28  GLUCOSE 135* 145*  BUN 21 42*  CREATININE 0.73 0.80  CALCIUM 9.0 8.3*   LFT Recent Labs    2018-07-29 1427 07/08/2018 0530  BILITOT 0.4 0.5  ALKPHOS 56 43  AST 17 14*  ALT 16 13  PROT 6.5 5.6*  ALBUMIN 3.2* 2.6*    Lipase No results for input(s): LIPASE in the last 72 hours.  PT/INR No results for input(s): LABPROT, INR in the last 72 hours.    Imaging Studies: Dg Chest Port 1 View  Result Date: 2018-07-29 CLINICAL DATA:  Hemoptysis. EXAM: PORTABLE CHEST 1 VIEW COMPARISON:  10/03/2016 FINDINGS: Low lung volumes with patchy left basilar airspace opacities suspicious for pneumonia and atelectasis. No overt pulmonary edema. No effusion or pneumothorax. Status post CABG. Moderate aortic atherosclerosis at the arch without aneurysmal dilatation is noted. Slight elevation of right hemidiaphragm. No acute osseous appearing abnormality. IMPRESSION: Patchy consolidations at the left lung base suspicious for pneumonia and/or atelectasis. Aortic atherosclerosis without aneurysmal dilatation. Electronically Signed   By: Tollie Eth M.D.   On: 07-29-18 15:40  [4 week]   Impression: 84 year old gentleman  presented from skilled nursing facility with reported vomiting and questionable hematemesis.  Suspected aspiration pneumonia as well.  Patient developed A. fib with RVR in the 140s to 150s overnight, improvement in heart rate with metoprolol.  Decline in hemoglobin from 12 down to 9.9.  BUN elevated out of proportion  to creatinine this morning.  No melena noted.  Would be concerned for possibility of upper GI bleeding.  No known NSAIDs.  Takes aspirin 81 mg daily.  Plan: Would consider upper endoscopy. To discuss timing with Dr. Jena Gauss in light of aspiration pneumonia.   PPI.   We would like to thank you for the opportunity to participate in the care of Juan Patel.  Leanna Battles. Dixon Boos [facility] [phone] 10/10/20199:14 AM     LOS: 0 days   Addendum: Plan for EGD today. I spoke with patient's wife David Rodriquez [facility]) who is in agreement with the plan. She plans to come to [facility] prior to procedure and will be available to talk with Dr. Jena Gauss pre/post EGD.   Leanna Battles. Dixon Boos [facility] [phone] 10/10/201910:16 AM

## 2018-07-15 NOTE — Progress Notes (Signed)
Night shift telemetry coverage note.  The patient is currently sleeping in NAD, but his cardiac telemetry shows atrial fibrillation with RVR in the 140s and 150s.  Not sure if the patient had metoprolol p.o. yesterday.  I saw him after hematemesis just before midnight and he was in no acute distress.  However, oral mucosa seem to be dry.  I will order a normal saline bolus with metoprolol 5 mg IVP x1 dose.  Although magnesium is normal, I will supplemented with max sulfate 2 g IVPB due to A. fib with RVR.  Sanda Klein, MD

## 2018-07-15 NOTE — [place] FL2 (Signed)
[place] MEDICAID FL2 LEVEL OF CARE SCREENING TOOL     IDENTIFICATION  Patient Name: Juan Patel Birthdate: 1926-05-01 Sex: male Admission Date (Current Location): 07/22/2018  [facility] and [place] Number:  [employer] and Address:  [facility],  [address], [place] [address]      Provider Number: [id number]  Attending Physician Name and Address:  Erick Blinks, DO  Relative Name and Phone Number:       Current Level of Care: Other (Comment)(observation) Recommended Level of Care: Skilled Nursing Facility Prior Approval Number:    Date Approved/Denied:   PASRR Number:    Discharge Plan: SNF    Current Diagnoses: Patient Active Problem List   Diagnosis Date Noted  . Anemia   . N&V (nausea and vomiting) 07/12/2018  . Aftercare following surgery of the circulatory system 03/25/2016  . Abdominal aortic aneurysm without rupture (HCC) 08/16/2014  . AAA (abdominal aortic aneurysm) without rupture (HCC) 08/01/2014  . Occlusion and stenosis of carotid artery without mention of cerebral infarction 02/14/2014  . Aftercare following surgery of the circulatory system, NEC 07/26/2013  . AAA (abdominal aortic aneurysm) (HCC) 08/17/2012  . Dementia (HCC) 10/17/2011  . Carotid stenosis 07/30/2011  . Confusion 07/04/2011  . Murmur 07/04/2011  . HTN (hypertension) 07/04/2011  . DYSLIPIDEMIA 10/23/2010  . CORONARY ATHEROSCLEROSIS NATIVE CORONARY ARTERY 10/23/2010  . ABDOMINAL AORTIC ANEURYSM 06/14/2009    Orientation RESPIRATION BLADDER Height & Weight     Self  Normal Incontinent Weight: 156 lb 12 oz (71.1 kg) Height:  6\' 2"  (188 cm)  BEHAVIORAL SYMPTOMS/MOOD NEUROLOGICAL BOWEL NUTRITION STATUS      Incontinent Diet(see discharge summary )  AMBULATORY STATUS COMMUNICATION OF NEEDS Skin   Extensive Assist   Normal                       Personal Care Assistance Level of Assistance  Bathing, Feeding, Dressing Bathing Assistance: Maximum  assistance Feeding assistance: Maximum assistance Dressing Assistance: Maximum assistance     Functional Limitations Info  Sight, Hearing, Speech Sight Info: Adequate Hearing Info: Adequate Speech Info: Adequate    SPECIAL CARE FACTORS FREQUENCY                       Contractures Contractures Info: Not present    Additional Factors Info  Code Status, Allergies, Psychotropic Code Status Info: DNR Allergies Info: NKA Psychotropic Info: Lexapro         Current Medications (14-Aug-2018):  This is the current hospital active medication list Current Facility-Administered Medications  Medication Dose Route Frequency Provider Last Rate Last Dose  . 0.45 % sodium chloride infusion   Intravenous Continuous Maurilio Lovely D, DO 75 mL/hr at 2018-08-14 1126    . acetaminophen (TYLENOL) tablet 650 mg  650 mg Oral Q6H PRN Pearson Grippe, MD       Or  . acetaminophen (TYLENOL) suppository 650 mg  650 mg Rectal Q6H PRN Pearson Grippe, MD      . acidophilus (RISAQUAD) capsule 1 capsule  1 capsule Oral Daily Pearson Grippe, MD   1 capsule at 2018/08/14 0929  . atorvastatin (LIPITOR) tablet 10 mg  10 mg Oral Farrel Demark, MD   10 mg at 07/13/2018 2238  . Chlorhexidine Gluconate Cloth 2 % PADS 6 each  6 each Topical Q0600 Maurilio Lovely D, DO   6 each at 14-Aug-2018 8119  . escitalopram (LEXAPRO) tablet 15 mg  15  mg Oral Daily Pearson Grippe, MD   15 mg at 08/05/2018 1610  . insulin aspart (novoLOG) injection 0-9 Units  0-9 Units Subcutaneous Q4H Shah, Pratik D, DO   2 Units at 08/03/2018 1127  . memantine (NAMENDA XR) 24 hr capsule 28 mg  28 mg Oral QHS Pearson Grippe, MD   28 mg at July 31, 2018 2245  . metoprolol tartrate (LOPRESSOR) tablet 12.5 mg  12.5 mg Oral BID Pearson Grippe, MD   12.5 mg at 07/11/2018 0929  . multivitamin with minerals tablet 1 tablet  1 tablet Oral q morning - 10a Pearson Grippe, MD   1 tablet at 07/09/2018 [phone]  . mupirocin ointment (BACTROBAN) 2 % 1 application  1 application Nasal BID Maurilio Lovely D, DO   1  application at 07/25/2018 1127  . ondansetron (ZOFRAN) injection 4 mg  4 mg Intravenous Q6H PRN Bobette Mo, MD   4 mg at 07/31/2018 2351  . pantoprazole (PROTONIX) EC tablet 40 mg  40 mg Oral BID AC Tiffany Kocher, PA-C   40 mg at 08/02/2018 0929  . piperacillin-tazobactam (ZOSYN) IVPB 3.375 g  3.375 g Intravenous Elza Rafter, MD 12.5 mL/hr at 07/25/2018 0934 3.375 g at 08/01/2018 0934  . tamsulosin (FLOMAX) capsule 0.4 mg  0.4 mg Oral QPC supper Pearson Grippe, MD   0.4 mg at 07-31-2018 2238     Discharge Medications: Please see discharge summary for a list of discharge medications.  Relevant Imaging Results:  Relevant Lab Results:   Additional Information    Correen Bubolz, Juleen China, LCSW

## 2018-07-15 NOTE — Progress Notes (Signed)
PROGRESS NOTE    Juan Patel  WUJ:[id number] DOB: [birth year] DOA: 07/23/2018 PCP: Joette Catching, MD   Brief Narrative:   This is a 84 year old Caucasian male with history of type 2 diabetes, hypertension, dyslipidemia, CAD status post CABG, and AAA who presented from [facility] with hematemesis and aspiration pneumonia.  He has been started on Protonix drip and has been seen by GI who will consider upper endoscopy in the near future.  He has been started on IV Zosyn for his pneumonia.  He did have an episode of A. fib with RVR last night which has now resolved and he is currently in sinus rhythm at 90 bpm.  Assessment & Plan:   Principal Problem:   N&V (nausea and vomiting) Active Problems:   Anemia  1. Acute blood loss anemia secondary to hematemesis.  Suspect upper GI bleed.  Appreciate gastroenterology consultation for possible EGD.  Continue on Protonix and maintain n.p.o.  Monitor CBC closely. 2. Aspiration pneumonia-questionable on left side.  Monitor urine strep and Legionella antigen.  Continue on IV Zosyn in the meantime and monitor blood cultures.  Recheck CBC and monitor symptomatology in a.m. 3. Transient A. fib with RVR.  Patient is not currently a candidate for any anticoagulation given suspicion of acute GI bleed.  His rate is apparently well controlled at this time and will maintain on oral metoprolol as scheduled for now.  4. CAD status post CABG.  Continue to hold aspirin and maintain on Lipitor metoprolol. 5. Type 2 diabetes.  Hold metformin and maintain on sliding scale insulin. 6. Dementia.  Continue Namenda XR 28 mg p.o. daily. 7. BPH.  Continue Flomax daily.    DVT prophylaxis: SCDs Code Status: DNR Family Communication: None at bedside, daughter was spoken to on admission Disposition Plan: Plan for likely EGD per GI.  Continue on IV Zosyn with telemetry monitoring.   Consultants:   GI  Procedures:   None  Antimicrobials:   Zosyn  10/9->   Subjective: Patient seen and evaluated today with no new acute complaints or concerns. No further hematemesis currently noted.  His rate is currently sinus at 90 bpm.  Objective: Vitals:   07/29/2018 2243 07-27-18 0333 07-27-18 0451 27-Jul-2018 0614  BP: 113/69 (!) 86/59  (!) 98/55  Pulse: (!) 118 (!) 153  85  Resp: 16   14  Temp: 99.8 F (37.7 C) 98.4 F (36.9 C)  98.6 F (37 C)  TempSrc: Oral Oral  Oral  SpO2: 97%   92%  Weight:   71.1 kg   Height:        Intake/Output Summary (Last 24 hours) at 07-27-18 0951 Last data filed at 07/27/2018 0351 Gross per 24 hour  Intake 390.84 ml  Output -  Net 390.84 ml   Filed Weights   07/08/2018 2024 2018/07/27 0451  Weight: 70.5 kg 71.1 kg    Examination:  General exam: Appears calm and comfortable, somnolent. Respiratory system: Clear to auscultation. Respiratory effort normal.  Currently on nasal cannula. Cardiovascular system: S1 & S2 heard, RRR. No JVD, murmurs, rubs, gallops or clicks. No pedal edema. Gastrointestinal system: Abdomen is nondistended, soft and nontender. No organomegaly or masses felt. Normal bowel sounds heard. Central nervous system: Somnolent. Extremities: Symmetric 5 x 5 power. Skin: No rashes, lesions or ulcers Psychiatry: Somnolent, and cannot complete assessment.    Data Reviewed: I have personally reviewed following labs and imaging studies  CBC: Recent Labs  Lab 07/27/2018 1427 07/27/2018 2347 07-27-18 0530  WBC 13.6* 18.4* 10.4  NEUTROABS 8.5*  --   --   HGB 12.0* 11.1* 9.9*  HCT 38.2* 36.1* 31.4*  MCV 102.4* 103.4* 105.0*  PLT 238 242 228   Basic Metabolic Panel: Recent Labs  Lab 07-16-18 1427 July 16, 2018 2347 07/28/2018 0530  NA 140  --  141  K 4.4  --  5.2*  CL 104  --  107  CO2 28  --  28  GLUCOSE 135*  --  145*  BUN 21  --  42*  CREATININE 0.73  --  0.80  CALCIUM 9.0  --  8.3*  MG  --  2.0  --    GFR: Estimated Creatinine Clearance: 63 mL/min (by C-G formula based on SCr  of 0.8 mg/dL). Liver Function Tests: Recent Labs  Lab 07-16-2018 1427 07/11/2018 0530  AST 17 14*  ALT 16 13  ALKPHOS 56 43  BILITOT 0.4 0.5  PROT 6.5 5.6*  ALBUMIN 3.2* 2.6*   No results for input(s): LIPASE, AMYLASE in the last 168 hours. No results for input(s): AMMONIA in the last 168 hours. Coagulation Profile: No results for input(s): INR, PROTIME in the last 168 hours. Cardiac Enzymes: No results for input(s): CKTOTAL, CKMB, CKMBINDEX, TROPONINI in the last 168 hours. BNP (last 3 results) No results for input(s): PROBNP in the last 8760 hours. HbA1C: No results for input(s): HGBA1C in the last 72 hours. CBG: Recent Labs  Lab 07/16/2018 2240 07/06/2018 0753  GLUCAP 162* 146*   Lipid Profile: No results for input(s): CHOL, HDL, LDLCALC, TRIG, CHOLHDL, LDLDIRECT in the last 72 hours. Thyroid Function Tests: No results for input(s): TSH, T4TOTAL, FREET4, T3FREE, THYROIDAB in the last 72 hours. Anemia Panel: No results for input(s): VITAMINB12, FOLATE, FERRITIN, TIBC, IRON, RETICCTPCT in the last 72 hours. Sepsis Labs: No results for input(s): PROCALCITON, LATICACIDVEN in the last 168 hours.  Recent Results (from the past 240 hour(s))  MRSA PCR Screening     Status: Abnormal   Collection Time: 07/11/2018 12:24 AM  Result Value Ref Range Status   MRSA by PCR POSITIVE (A) NEGATIVE Final    Comment:        The GeneXpert MRSA Assay (FDA approved for NASAL specimens only), is one component of a comprehensive MRSA colonization surveillance program. It is not intended to diagnose MRSA infection nor to guide or monitor treatment for MRSA infections. RESULT CALLED TO, READ BACK BY AND VERIFIED WITH: BRITTNEY @ 0851 ON [id number] BY HENDERSON L. Performed at [facility], [address] [place], [place] [address]          Radiology Studies: Dg Chest [facility] 1 View  Result Date: 07-16-2018 CLINICAL DATA:  Hemoptysis. EXAM: PORTABLE CHEST 1 VIEW COMPARISON:  10/03/2016  FINDINGS: Low lung volumes with patchy left basilar airspace opacities suspicious for pneumonia and atelectasis. No overt pulmonary edema. No effusion or pneumothorax. Status post CABG. Moderate aortic atherosclerosis at the arch without aneurysmal dilatation is noted. Slight elevation of right hemidiaphragm. No acute osseous appearing abnormality. IMPRESSION: Patchy consolidations at the left lung base suspicious for pneumonia and/or atelectasis. Aortic atherosclerosis without aneurysmal dilatation. Electronically Signed   By: Tollie Eth M.D.   On: 07/16/2018 15:40        Scheduled Meds: . acidophilus  1 capsule Oral Daily  . atorvastatin  10 mg Oral QHS  . Chlorhexidine Gluconate Cloth  6 each Topical Q0600  . escitalopram  15 mg Oral Daily  . memantine  28 mg Oral QHS  .  metoprolol tartrate  12.5 mg Oral BID  . multivitamin with minerals  1 tablet Oral q morning - 10a  . mupirocin ointment  1 application Nasal BID  . pantoprazole  40 mg Oral BID AC  . tamsulosin  0.4 mg Oral QPC supper   Continuous Infusions: . piperacillin-tazobactam (ZOSYN)  IV 3.375 g (07/10/2018 0934)     LOS: 0 days    Time spent: 30 minutes    Sharran Caratachea Hoover Brunette, DO [facility] Pager [phone]  If 7PM-7AM, please contact night-coverage [email or web address] Password [email or web address] 07/30/2018, 9:51 AM

## 2018-07-16 ENCOUNTER — Encounter (HOSPITAL_COMMUNITY): Payer: Self-pay | Admitting: Primary Care

## 2018-07-16 DIAGNOSIS — Z7189 Other specified counseling: Secondary | ICD-10-CM

## 2018-07-16 DIAGNOSIS — J690 Pneumonitis due to inhalation of food and vomit: Principal | ICD-10-CM

## 2018-07-16 DIAGNOSIS — Z515 Encounter for palliative care: Secondary | ICD-10-CM

## 2018-07-16 LAB — CBC
HEMATOCRIT: 22.8 % — AB (ref 39.0–52.0)
Hemoglobin: 7.1 g/dL — ABNORMAL LOW (ref 13.0–17.0)
MCH: 32 pg (ref 26.0–34.0)
MCHC: 31.1 g/dL (ref 30.0–36.0)
MCV: 102.7 fL — AB (ref 80.0–100.0)
NRBC: 0 % (ref 0.0–0.2)
Platelets: 174 10*3/uL (ref 150–400)
RBC: 2.22 MIL/uL — AB (ref 4.22–5.81)
RDW: 13.5 % (ref 11.5–15.5)
WBC: 11.8 10*3/uL — ABNORMAL HIGH (ref 4.0–10.5)

## 2018-07-16 LAB — GLUCOSE, CAPILLARY
Glucose-Capillary: 124 mg/dL — ABNORMAL HIGH (ref 70–99)
Glucose-Capillary: 147 mg/dL — ABNORMAL HIGH (ref 70–99)
Glucose-Capillary: 149 mg/dL — ABNORMAL HIGH (ref 70–99)

## 2018-07-16 LAB — BASIC METABOLIC PANEL
BUN: 42 mg/dL — AB (ref 8–23)
CHLORIDE: 109 mmol/L (ref 98–111)
CO2: 29 mmol/L (ref 22–32)
Calcium: 7.9 mg/dL — ABNORMAL LOW (ref 8.9–10.3)
Creatinine, Ser: 1.01 mg/dL (ref 0.61–1.24)
GFR calc Af Amer: >60 mL/min (ref >60)
GFR calc non Af Amer: >60 mL/min (ref >60)
GLUCOSE: 150 mg/dL — AB (ref 70–99)
POTASSIUM: 4.5 mmol/L (ref 3.5–5.1)
Sodium: 140 mmol/L (ref 135–145)

## 2018-07-16 MED ORDER — METOPROLOL TARTRATE 5 MG/5ML IV SOLN
5.0000 mg | Freq: Once | INTRAVENOUS | Status: AC
  Administered 2018-07-16: 5 mg via INTRAVENOUS
  Filled 2018-07-16: qty 5

## 2018-07-16 MED ORDER — SODIUM CHLORIDE 0.45 % IV BOLUS
1000.0000 mL | Freq: Once | INTRAVENOUS | Status: AC
  Administered 2018-07-16: 1000 mL via INTRAVENOUS

## 2018-07-16 MED ORDER — LORAZEPAM 2 MG/ML IJ SOLN
1.0000 mg | Freq: Three times a day (TID) | INTRAMUSCULAR | Status: DC
  Administered 2018-07-16 – 2018-07-19 (×9): 1 mg via INTRAVENOUS
  Filled 2018-07-16 (×9): qty 1

## 2018-07-16 MED ORDER — POLYVINYL ALCOHOL 1.4 % OP SOLN: 2.0000 [drp] | OPHTHALMIC | Status: DC | PRN

## 2018-07-16 MED ORDER — MORPHINE SULFATE (CONCENTRATE) 10 MG/0.5ML PO SOLN
2.6000 mg | ORAL | Status: DC | PRN
  Administered 2018-07-19: 2.6 mg via ORAL
  Filled 2018-07-16: qty 0.5

## 2018-07-16 MED ORDER — MORPHINE SULFATE (CONCENTRATE) 10 MG/0.5ML PO SOLN
2.6000 mg | ORAL | Status: DC
  Administered 2018-07-16 – 2018-07-19 (×18): 2.6 mg via SUBLINGUAL
  Filled 2018-07-16 (×18): qty 0.5

## 2018-07-16 MED ORDER — ATROPINE SULFATE 1 % OP SOLN: 2.0000 [drp] | Freq: Four times a day (QID) | OPHTHALMIC | Status: DC | PRN

## 2018-07-16 NOTE — Consult Note (Addendum)
Consultation Note Date: 07/16/2018   Patient Name: Juan Patel  DOB: [birth year]  MRN: [id number]  Age / Sex: 84 y.o., male  PCP: Joette Catching, MD Referring Physician: Erick Blinks, DO  Reason for Consultation: Establishing goals of care and Inpatient hospice referral  HPI/Patient Profile: 84 y.o. male  with past medical history of AAA, coronary artery disease with history of CABG, beginning stages of dementia, type 2 diabetes, high blood pressure and cholesterol, resident of SNF approximately 1 year, latest resident is [facility] admitted on 08-08-2018 with upper GI bleed, blood loss anemia.   Clinical Assessment and Goals of Care: Juan Patel is resting quietly in bed.  He does not interact when I touch his arm.  Present today at bedside his wife of 25 years, Lura Em, and daughter Victorino Dike.  Present today for family meeting is chaplain Delford Field.  We talked about how to care for Juan Patel.  Victorino Dike shares in detail her meeting with hospitalist, expected outcomes, risks, prognosis.  Family elects for comfort measures, comfort and dignity at end of life.  We talked about symptom management, family elects scheduled and PRN morphine, and schedule Ativan for comfort and dignity.  They also request Foley catheter for end-of-life skin maintenance.  We discussed the benefits of residential hospice.  Daughter Victorino Dike states that she has been told that Juan Patel likely has 1 to 3 days and her preference that he die at [facility].  Nursing staff updated related to goals of care, symptom management. Hospitalist updated related to goals of care, symptom management.  Healthcare power of attorney HCPOA -daughter Victorino Dike states that she and her 2 brothers share healthcare power of attorney.  Wife of 25 years, Lura Em, is at bedside and does not disagree.  Victorino Dike is requested to bring healthcare power of attorney paperwork  copy for chart.   SUMMARY OF RECOMMENDATIONS   FULL COMFORT CARE No transfer to Residential Hospice.   Code Status/Advance Care Planning:  DNR  Symptom Management:   Addition of scheduled and as needed sublingual morphine and IV Ativan as sublingual/oral is not available.  Symptom management per hospice protocol.  Palliative Prophylaxis:   Aspiration and Turn Reposition  Additional Recommendations (Limitations, Scope, Preferences):  Full Comfort Care  Psycho-social/Spiritual:   Desire for further Chaplaincy support:no  Additional Recommendations: Caregiving  Support/Resources and Education on Hospice  Prognosis:   < 2 weeks  Discharge Planning: Anticipated Hospital Death      Primary Diagnoses: Present on Admission: . N&V (nausea and vomiting) . Nausea and vomiting   I have reviewed the medical record, interviewed the patient and family, and examined the patient. The following aspects are pertinent.  Past Medical History:  Diagnosis Date  . Abdominal aortic aneurysm (HCC)   . CAD (coronary artery disease)    S/P CABG   . Dementia (HCC)    beginning stages- on aricept  . Depression   . Diabetes mellitus    type 2.  . Dyslipidemia   . HTN (hypertension) 07/04/2011  Social History   Socioeconomic History  . Marital status: Married    Spouse name: Not on file  . Number of children: Not on file  . Years of education: Not on file  . Highest education level: Not on file  Occupational History  . Not on file  Social Needs  . Financial resource strain: Not on file  . Food insecurity:    Worry: Not on file    Inability: Not on file  . Transportation needs:    Medical: Not on file    Non-medical: Not on file  Tobacco Use  . Smoking status: Former Smoker    Packs/day: 0.50    Types: Cigarettes    Last attempt to quit: 10/26/1961    Years since quitting: 56.7  . Smokeless tobacco: Never Used  Substance and Sexual Activity  . Alcohol use: Yes     Alcohol/week: 0.0 standard drinks    Comment: rarely  . Drug use: No  . Sexual activity: Not on file  Lifestyle  . Physical activity:    Days per week: Not on file    Minutes per session: Not on file  . Stress: Not on file  Relationships  . Social connections:    Talks on phone: Not on file    Gets together: Not on file    Attends religious service: Not on file    Active member of club or organization: Not on file    Attends meetings of clubs or organizations: Not on file    Relationship status: Not on file  Other Topics Concern  . Not on file  Social History Narrative  . Not on file   Family History  Problem Relation Age of Onset  . Heart disease Father        Heart Disease before age 22  . Deep vein thrombosis Father        Varicose Veins  . Cancer Sister   . Heart disease Sister   . Hypertension Sister   . Cancer Brother   . Deep vein thrombosis Mother        Varicose Veins  . Heart attack Mother   . Hypertension Sister   . Hypertension Daughter    Scheduled Meds: . acidophilus  1 capsule Oral Daily  . atorvastatin  10 mg Oral QHS  . Chlorhexidine Gluconate Cloth  6 each Topical Q0600  . escitalopram  15 mg Oral Daily  . insulin aspart  0-9 Units Subcutaneous Q4H  . memantine  28 mg Oral QHS  . metoprolol tartrate  12.5 mg Oral BID  . multivitamin with minerals  1 tablet Oral q morning - 10a  . mupirocin ointment  1 application Nasal BID  . pantoprazole  40 mg Oral BID AC  . tamsulosin  0.4 mg Oral QPC supper   Continuous Infusions: . sodium chloride 75 mL/hr at 07/16/18 0528   PRN Meds:.acetaminophen **OR** acetaminophen, ondansetron (ZOFRAN) IV Medications Prior to Admission:  Prior to Admission medications   Medication Sig Start Date End Date Taking? Authorizing Provider  acetaminophen (TYLENOL 8 HOUR) 650 MG CR tablet Take 650 mg by mouth 3 (three) times daily.   Yes [provider]  aspirin 81 MG tablet Take 81 mg by mouth daily.    Yes  [provider]  atorvastatin (LIPITOR) 10 MG tablet Take 10 mg by mouth at bedtime.    Yes [provider]  Bacillus Coagulans-Inulin (PROBIOTIC FORMULA PO) Take 1 tablet by mouth daily.  Yes [provider]  escitalopram (LEXAPRO) 20 MG tablet Take 15 mg by mouth daily.    Yes [provider]  Memantine HCl ER 28 MG CP24 Take 28 mg by mouth at bedtime.    Yes [provider]  metFORMIN (GLUCOPHAGE) 500 MG tablet Take 500 mg by mouth 2 (two) times daily with a meal.    Yes [provider]  metoprolol tartrate (LOPRESSOR) 25 MG tablet Take 12.5 mg by mouth 2 (two) times daily.    Yes [provider]  Multiple Vitamin (MULTIVITAMIN WITH MINERALS) TABS tablet Take 1 tablet by mouth every morning.    Yes [provider]  senna-docusate (SENOKOT-S) 8.6-50 MG tablet Take 1 tablet by mouth 2 (two) times daily.   Yes [provider]  tamsulosin (FLOMAX) 0.4 MG CAPS capsule Take 0.4 mg by mouth daily after supper.   Yes [provider]   No Known Allergies Review of Systems  Unable to perform ROS: Acuity of condition    Physical Exam  Constitutional: No distress.  Cardiovascular: Normal rate.  Nursing note and vitals reviewed.   Vital Signs: BP (!) 98/54   Pulse 79   Temp 97.8 F (36.6 C) (Oral)   Resp 20   Ht 6\' 2"  (1.88 m)   Wt 71.2 kg   SpO2 100%   BMI 20.15 kg/m  Pain Scale: PAINAD       SpO2: SpO2: 100 % O2 Device:SpO2: 100 % O2 Flow Rate: .   IO: Intake/output summary:   Intake/Output Summary (Last 24 hours) at 07/16/2018 1055 Last data filed at 07/16/2018 0900 Gross per 24 hour  Intake 1208.9 ml  Output -  Net 1208.9 ml    LBM:   Baseline Weight: Weight: 70.5 kg Most recent weight: Weight: 71.2 kg     Palliative Assessment/Data:   Flowsheet Rows     Most Recent Value  Intake Tab  Referral Department  Hospitalist  Unit at Time of Referral  Cardiac/Telemetry Unit    Palliative Care Primary Diagnosis  Other (Comment) [GI bleed/aspiration pneumonia]  Date Notified  July 21, 2018  Palliative Care Type  New Palliative care  Reason for referral  Counsel Regarding Hospice  Date of Admission  08/01/2018  Date first seen by Palliative Care  07/16/18  # of days Palliative referral response time  1 Day(s)  # of days IP prior to Palliative referral  1  Clinical Assessment  Palliative Performance Scale Score  30%  Pain Max last 24 hours  Not able to report  Pain Min Last 24 hours  Not able to report  Dyspnea Max Last 24 Hours  Not able to report  Dyspnea Min Last 24 hours  Not able to report  Psychosocial & Spiritual Assessment  Palliative Care Outcomes  Patient/Family meeting held?  Yes  Who was at the meeting?  Wife and daughter at bedside  Palliative Care Outcomes  Counseled regarding hospice, Clarified goals of care  Patient/Family wishes: Interventions discontinued/not started   Mechanical Ventilation      Time In: 1045 Time Out: 1155 Time Total: 70 minutes Greater than 50%  of this time was spent counseling and coordinating care related to the above assessment and plan.  Signed by: Katheran Awe, NP   Please contact Palliative Medicine Team phone at [phone] for questions and concerns.  For individual provider: See Loretha Stapler

## 2018-07-16 NOTE — Progress Notes (Signed)
Present with Juan Patel, his wife, Lura Em, and his daughter, Victorino Dike for emotional and spiritual support along with Lillia Carmel, NP, Palliative Medicine for an end of life/comfort care discussion. I stayed afterwards for support in their processing. We prayed together also.

## 2018-07-16 NOTE — Progress Notes (Signed)
PROGRESS NOTE    Juan Patel  ZOX:[id number] DOB: [birth year] DOA: 07/27/2018 PCP: Joette Catching, MD   Brief Narrative:   This is a 84 year old Caucasian male with history of type 2 diabetes, hypertension, dyslipidemia, CAD status post CABG, and AAA who presented from [facility] with hematemesis and aspiration pneumonia.  He has been started on Protonix drip and has been seen by GI who will consider upper endoscopy in the near future.  He has been started on IV Zosyn for his pneumonia.  He did have an episode of A. fib with RVR last night which has now resolved and he is currently in sinus rhythm at 90 bpm.  Family members have decided to pursue comfort measures and palliative care will be involved.  Assessment & Plan:   Principal Problem:   N&V (nausea and vomiting) Active Problems:   Anemia  1. Acute blood loss anemia-worsening secondary to hematemesis.  Suspect upper GI bleed.  Appreciate gastroenterology consultation.  Poor candidate for EGD and family members agree.  They would like to pursue palliative care and comfort measures.  Palliative care consultation obtained.  Poor prognosis of survival in the next 48 to 72 hours. 2. Aspiration pneumonia-questionable on left side.  Discontinue Zosyn today on account of the fact that patient will be comfort measures. 3. Transient A. fib with RVR.  Patient is not currently a candidate for any anticoagulation given suspicion of acute GI bleed.    Will discontinue telemetry and oral metoprolol. 4. CAD status post CABG.  Continue to hold aspirin and will also plan to hold metoprolol and Lipitor. 5. Type 2 diabetes.   Discontinue sliding scale insulin and metformin. 6. Dementia.   Discontinue Namenda. 7. BPH.  Continue Flomax daily for comfort.    DVT prophylaxis: SCDs Code Status: DNR Family Communication: None at bedside, daughter was spoken to on admission Disposition Plan: Plan for likely EGD per GI.  Continue on IV Zosyn  with telemetry monitoring.   Consultants:   GI  Palliative Care  Procedures:   None  Antimicrobials:   Zosyn 10/9->10/11  Subjective: Patient seen and evaluated today with no new acute complaints or concerns. No acute concerns or events noted overnight.  He continues to have worsening hemoglobin levels noted on lab work this morning.  Objective: Vitals:   07/16/18 0316 07/16/18 0410 07/16/18 0620 07/16/18 0841  BP: (!) 92/57  (!) 100/54 (!) 98/54  Pulse: (!) 140  81 79  Resp:      Temp:   97.8 F (36.6 C)   TempSrc:   Oral   SpO2: 97%  100%   Weight:  71.2 kg    Height:        Intake/Output Summary (Last 24 hours) at 07/16/2018 1100 Last data filed at 07/16/2018 0900 Gross per 24 hour  Intake 1208.9 ml  Output -  Net 1208.9 ml   Filed Weights   07/12/2018 0451 07/23/2018 1328 07/16/18 0410  Weight: 71.1 kg 71.1 kg 71.2 kg    Examination:  General exam: Appears calm and comfortable; somnolent Respiratory system: Clear to auscultation. Respiratory effort normal. Cardiovascular system: S1 & S2 heard, RRR. No JVD, murmurs, rubs, gallops or clicks. No pedal edema. Gastrointestinal system: Abdomen is nondistended, soft and nontender. No organomegaly or masses felt. Normal bowel sounds heard. Central nervous system: Somnolent and cannot be assessed. Extremities: Symmetric 5 x 5 power. Skin: No rashes, lesions or ulcers Psychiatry: Cannot be assessed    Data Reviewed: I have  personally reviewed following labs and imaging studies  CBC: Recent Labs  Lab 2018/07/30 1427 07-30-18 2347 07/26/2018 0530 07/16/18 0543  WBC 13.6* 18.4* 10.4 11.8*  NEUTROABS 8.5*  --   --   --   HGB 12.0* 11.1* 9.9* 7.1*  HCT 38.2* 36.1* 31.4* 22.8*  MCV 102.4* 103.4* 105.0* 102.7*  PLT 238 242 228 174   Basic Metabolic Panel: Recent Labs  Lab Jul 30, 2018 1427 07-30-18 2347 07/13/2018 0530 07/16/18 0543  NA 140  --  141 140  K 4.4  --  5.2* 4.5  CL 104  --  107 109  CO2 28   --  28 29  GLUCOSE 135*  --  145* 150*  BUN 21  --  42* 42*  CREATININE 0.73  --  0.80 1.01  CALCIUM 9.0  --  8.3* 7.9*  MG  --  2.0  --   --    GFR: Estimated Creatinine Clearance: 49.9 mL/min (by C-G formula based on SCr of 1.01 mg/dL). Liver Function Tests: Recent Labs  Lab 07/30/2018 1427 07/26/2018 0530  AST 17 14*  ALT 16 13  ALKPHOS 56 43  BILITOT 0.4 0.5  PROT 6.5 5.6*  ALBUMIN 3.2* 2.6*   No results for input(s): LIPASE, AMYLASE in the last 168 hours. No results for input(s): AMMONIA in the last 168 hours. Coagulation Profile: No results for input(s): INR, PROTIME in the last 168 hours. Cardiac Enzymes: No results for input(s): CKTOTAL, CKMB, CKMBINDEX, TROPONINI in the last 168 hours. BNP (last 3 results) No results for input(s): PROBNP in the last 8760 hours. HbA1C: No results for input(s): HGBA1C in the last 72 hours. CBG: Recent Labs  Lab 07/31/2018 1609 07/25/2018 2002 07/16/18 0003 07/16/18 0401 07/16/18 0751  GLUCAP 135* 122* 147* 149* 124*   Lipid Profile: No results for input(s): CHOL, HDL, LDLCALC, TRIG, CHOLHDL, LDLDIRECT in the last 72 hours. Thyroid Function Tests: No results for input(s): TSH, T4TOTAL, FREET4, T3FREE, THYROIDAB in the last 72 hours. Anemia Panel: No results for input(s): VITAMINB12, FOLATE, FERRITIN, TIBC, IRON, RETICCTPCT in the last 72 hours. Sepsis Labs: No results for input(s): PROCALCITON, LATICACIDVEN in the last 168 hours.  Recent Results (from the past 240 hour(s))  MRSA PCR Screening     Status: Abnormal   Collection Time: 07/22/2018 12:24 AM  Result Value Ref Range Status   MRSA by PCR POSITIVE (A) NEGATIVE Final    Comment:        The GeneXpert MRSA Assay (FDA approved for NASAL specimens only), is one component of a comprehensive MRSA colonization surveillance program. It is not intended to diagnose MRSA infection nor to guide or monitor treatment for MRSA infections. RESULT CALLED TO, READ BACK BY AND VERIFIED  WITH: BRITTNEY @ 0851 ON [id number] BY HENDERSON L. Performed at [facility], [address], [place], [place] [address]          Radiology Studies: Dg Chest [facility] 1 View  Result Date: 30-Jul-2018 CLINICAL DATA:  Hemoptysis. EXAM: PORTABLE CHEST 1 VIEW COMPARISON:  10/03/2016 FINDINGS: Low lung volumes with patchy left basilar airspace opacities suspicious for pneumonia and atelectasis. No overt pulmonary edema. No effusion or pneumothorax. Status post CABG. Moderate aortic atherosclerosis at the arch without aneurysmal dilatation is noted. Slight elevation of right hemidiaphragm. No acute osseous appearing abnormality. IMPRESSION: Patchy consolidations at the left lung base suspicious for pneumonia and/or atelectasis. Aortic atherosclerosis without aneurysmal dilatation. Electronically Signed   By: Tollie Eth M.D.   On: 07-30-2018  15:40        Scheduled Meds: . acidophilus  1 capsule Oral Daily  . atorvastatin  10 mg Oral QHS  . Chlorhexidine Gluconate Cloth  6 each Topical Q0600  . escitalopram  15 mg Oral Daily  . insulin aspart  0-9 Units Subcutaneous Q4H  . memantine  28 mg Oral QHS  . metoprolol tartrate  12.5 mg Oral BID  . multivitamin with minerals  1 tablet Oral q morning - 10a  . mupirocin ointment  1 application Nasal BID  . pantoprazole  40 mg Oral BID AC  . tamsulosin  0.4 mg Oral QPC supper   Continuous Infusions: . sodium chloride 75 mL/hr at 07/16/18 0528     LOS: 1 day    Time spent: 30 minutes    Farhana Fellows Hoover Brunette, DO [facility] Pager [phone]  If 7PM-7AM, please contact night-coverage [email or web address] Password [email or web address] 07/16/2018, 11:00 AM

## 2018-07-17 NOTE — Progress Notes (Signed)
Patient resting in bed. No signs of discomfort noted. Patients family member refusing vital signs at this time. Requesting the patient be able to rest.

## 2018-07-17 NOTE — Progress Notes (Signed)
PROGRESS NOTE    Juan Patel  ZOX:[id number] DOB: 08/02/1926 DOA: Jul 31, 2018 PCP: Joette Catching, MD   Brief Narrative:   This is a 84 year old Caucasian male with history of type 2 diabetes, hypertension, dyslipidemia, CAD status post CABG, and AAA who presented from [facility] with hematemesis and aspiration pneumonia. He has been started on Protonix drip and has been seen by GI who will consider upper endoscopy in the near future. He has been started on IV Zosyn for his pneumonia. He did have an episode of A. fib with RVR last night which has now resolved and he is currently in sinus rhythm at 90 bpm.  Family members have decided to pursue comfort measures and palliative care will be involved.  Assessment & Plan:  Principal Problem: N&V (nausea and vomiting) Active Problems: Anemia  1. Acute blood loss anemia- secondary to hematemesis. Suspect upper GI bleed. Appreciate gastroenterology consultation.  Poor candidate for EGD and family members agree.    Endoscopy avoided and will continue palliative care.  Poor prognosis for survival. 2. Aspiration pneumonia-questionable on left side.  Discontinued Zosyn on 10/11 on account of the fact that patient will be comfort measures. 3. Transient A. fib with RVR. Discontinued anticoagulation and metoprolol. 4. CAD status post CABG. Discontinued aspirin, metoprolol, and Lipitor. 5. Type 2 diabetes.  Discontinue sliding scale insulin and metformin. 6. Dementia.  Discontinue Namenda. 7. BPH. Continue Flomax daily for comfort.  Continue comfort measures.  DVT prophylaxis:SCDs Code Status:DNR Family Communication:Spoke with wife, son, and daughter at bedside. Disposition Plan:Comfort measures   Consultants:  GI  Palliative Care  Procedures:  None  Antimicrobials:  Zosyn 10/9->10/11  Subjective: Patient seen and evaluated today with no new acute complaints or concerns. No acute concerns  or events noted overnight.  He appears to be comfortably resting while on hospice protocol.  Objective: Vitals:   07/16/18 0841 07/16/18 1432 07/16/18 2133 07/17/18 1155  BP: (!) 98/54 (!) 99/57 (!) 106/57   Pulse: 79 93 87 (!) 105  Resp:   16   Temp:   98.6 F (37 C)   TempSrc:   Axillary   SpO2:  98% 94% (!) 81%  Weight:      Height:        Intake/Output Summary (Last 24 hours) at 07/17/2018 1232 Last data filed at 07/17/2018 8119 Gross per 24 hour  Intake 127.76 ml  Output 1600 ml  Net -1472.24 ml   Filed Weights   07/07/2018 0451 07/21/2018 1328 07/16/18 0410  Weight: 71.1 kg 71.1 kg 71.2 kg    Examination:  General exam: Appears calm and comfortable, somnolent Respiratory system: Clear to auscultation. Respiratory effort normal. Cardiovascular system: S1 & S2 heard, RRR. No JVD, murmurs, rubs, gallops or clicks. No pedal edema. Gastrointestinal system: Abdomen is nondistended, soft and nontender. No organomegaly or masses felt. Normal bowel sounds heard. Central nervous system: Somnolent. Extremities: Cannot be assessed. Skin: No rashes, lesions or ulcers Psychiatry: Cannot be assessed.    Data Reviewed: I have personally reviewed following labs and imaging studies  CBC: Recent Labs  Lab 2018/07/31 1427 07/31/18 2347 07/24/2018 0530 07/16/18 0543  WBC 13.6* 18.4* 10.4 11.8*  NEUTROABS 8.5*  --   --   --   HGB 12.0* 11.1* 9.9* 7.1*  HCT 38.2* 36.1* 31.4* 22.8*  MCV 102.4* 103.4* 105.0* 102.7*  PLT 238 242 228 174   Basic Metabolic Panel: Recent Labs  Lab 07-31-2018 1427 07-31-2018 2347 08/03/2018 0530 07/16/18 0543  NA 140  --  141 140  K 4.4  --  5.2* 4.5  CL 104  --  107 109  CO2 28  --  28 29  GLUCOSE 135*  --  145* 150*  BUN 21  --  42* 42*  CREATININE 0.73  --  0.80 1.01  CALCIUM 9.0  --  8.3* 7.9*  MG  --  2.0  --   --    GFR: Estimated Creatinine Clearance: 49.9 mL/min (by C-G formula based on SCr of 1.01 mg/dL). Liver Function Tests: Recent  Labs  Lab 07/18/2018 1427 07/29/2018 0530  AST 17 14*  ALT 16 13  ALKPHOS 56 43  BILITOT 0.4 0.5  PROT 6.5 5.6*  ALBUMIN 3.2* 2.6*   No results for input(s): LIPASE, AMYLASE in the last 168 hours. No results for input(s): AMMONIA in the last 168 hours. Coagulation Profile: No results for input(s): INR, PROTIME in the last 168 hours. Cardiac Enzymes: No results for input(s): CKTOTAL, CKMB, CKMBINDEX, TROPONINI in the last 168 hours. BNP (last 3 results) No results for input(s): PROBNP in the last 8760 hours. HbA1C: No results for input(s): HGBA1C in the last 72 hours. CBG: Recent Labs  Lab 07/17/2018 1609 08/03/2018 2002 07/16/18 0003 07/16/18 0401 07/16/18 0751  GLUCAP 135* 122* 147* 149* 124*   Lipid Profile: No results for input(s): CHOL, HDL, LDLCALC, TRIG, CHOLHDL, LDLDIRECT in the last 72 hours. Thyroid Function Tests: No results for input(s): TSH, T4TOTAL, FREET4, T3FREE, THYROIDAB in the last 72 hours. Anemia Panel: No results for input(s): VITAMINB12, FOLATE, FERRITIN, TIBC, IRON, RETICCTPCT in the last 72 hours. Sepsis Labs: No results for input(s): PROCALCITON, LATICACIDVEN in the last 168 hours.  Recent Results (from the past 240 hour(s))  MRSA PCR Screening     Status: Abnormal   Collection Time: 08/04/2018 12:24 AM  Result Value Ref Range Status   MRSA by PCR POSITIVE (A) NEGATIVE Final    Comment:        The GeneXpert MRSA Assay (FDA approved for NASAL specimens only), is one component of a comprehensive MRSA colonization surveillance program. It is not intended to diagnose MRSA infection nor to guide or monitor treatment for MRSA infections. RESULT CALLED TO, READ BACK BY AND VERIFIED WITH: BRITTNEY @ 0851 ON [id number] BY HENDERSON L. Performed at [facility], [address] [place], [place] [address]          Radiology Studies: No results found.      Scheduled Meds: . LORazepam  1 mg Intravenous Q8H  . morphine CONCENTRATE  2.6 mg  Sublingual Q4H   Continuous Infusions:   LOS: 2 days    Time spent: 30 minutes    Cindie Rajagopalan Hoover Brunette, DO [facility] Pager [phone]  If 7PM-7AM, please contact night-coverage [email or web address] Password [facility] 07/17/2018, 12:32 PM

## 2018-07-17 NOTE — Progress Notes (Signed)
Patient's pulse and O2 saturation checked and charted. Pt's O2sat is 81%. This RN asked if family would like O2 placed to compensate for low O2sat. Pt's family declined, stating that they believe the nasal cannula will aggravate him and compromise his comfort. Family's wishes respected. Will continue to monitor.  Quita Skye, RN

## 2018-07-17 NOTE — Progress Notes (Signed)
Patient has a temp of 102.9. Patient's family declined tylenol suppository administration as they feel it will compromise his comfort. Family's wishes to be respected. Will continue to monitor.  Quita Skye, RN

## 2018-07-18 NOTE — Progress Notes (Signed)
PROGRESS NOTE    NOLAND PIZANO  ZOX:[id number] DOB: Sep 24, 1926 DOA: July 25, 2018 PCP: Joette Catching, MD   Brief Narrative:   This is a 84 year old Caucasian male with history of type 2 diabetes, hypertension, dyslipidemia, CAD status post CABG, and AAA who presented from [facility] with hematemesis and aspiration pneumonia. He has been started on Protonix drip and has been seen by GI who will consider upper endoscopy in the near future. He has been started on IV Zosyn for his pneumonia. He did have an episode of A. fib with RVR last night which has now resolved and he is currently in sinus rhythm at 90 bpm.  Family members have decided to pursue comfort measures and palliative care will be involved.  Assessment & Plan:  Principal Problem: N&V (nausea and vomiting) Active Problems: Anemia  1. Acute blood loss anemia-secondary to hematemesis. Suspect upper GI bleed. Appreciate gastroenterology consultation.Poor candidate for EGD and family members agree.   Endoscopy avoided and will continue palliative care.  Poor prognosis for survival. 2. Aspiration pneumonia-questionable on left side.Discontinued Zosyn on 10/11 on account of the fact that patient will be comfort measures. 3. Transient A. fib with RVR. Discontinued anticoagulation and metoprolol. 4. CAD status post CABG. Discontinued aspirin, metoprolol, and Lipitor. 5. Type 2 diabetes.Discontinue sliding scale insulin and metformin. 6. Dementia.Discontinue Namenda. 7. BPH. Continue Flomax dailyfor comfort.  Continue comfort measures.  DVT prophylaxis:SCDs Code Status:DNR Family Communication:Spoke with wife, son, and daughter at bedside. Disposition Plan:Comfort measures   Consultants:  GI  Palliative Care  Procedures:  None  Antimicrobials:  Zosyn 10/9->10/11  Subjective: Patient seen and evaluated today with no new acute complaints or concerns. No acute concerns  or events noted overnight. He appears to be comfortably resting while on hospice protocol.  Objective: Vitals:   07/17/18 1723 07/17/18 2307 07/18/18 0500 07/18/18 1207  BP:      Pulse: (!) 108 (!) 116 (!) 120 (!) 131  Resp:  (!) 28 (!) 30   Temp:  (!) 101.3 F (38.5 C) (!) 102.5 F (39.2 C) (!) 102.8 F (39.3 C)  TempSrc:  Axillary Axillary Axillary  SpO2: (!) 88% (!) 88% (!) 86% (!) 77%  Weight:      Height:        Intake/Output Summary (Last 24 hours) at 07/18/2018 1456 Last data filed at 07/18/2018 0500 Gross per 24 hour  Intake -  Output 875 ml  Net -875 ml   Filed Weights   07/18/2018 0451 07/23/2018 1328 07/16/18 0410  Weight: 71.1 kg 71.1 kg 71.2 kg    Examination:  General exam: Appears calm and comfortable  Respiratory system: Clear to auscultation. Respiratory effort normal. Cardiovascular system: S1 & S2 heard, RRR. No JVD, murmurs, rubs, gallops or clicks. No pedal edema. Gastrointestinal system: Abdomen is nondistended, soft and nontender. No organomegaly or masses felt. Normal bowel sounds heard. Central nervous system: Somnolent Extremities: Symmetric 5 x 5 power. Skin: No rashes, lesions or ulcers Psychiatry: Somnolent.    Data Reviewed: I have personally reviewed following labs and imaging studies  CBC: Recent Labs  Lab 07/25/18 1427 07/25/2018 2347 08/03/2018 0530 07/16/18 0543  WBC 13.6* 18.4* 10.4 11.8*  NEUTROABS 8.5*  --   --   --   HGB 12.0* 11.1* 9.9* 7.1*  HCT 38.2* 36.1* 31.4* 22.8*  MCV 102.4* 103.4* 105.0* 102.7*  PLT 238 242 228 174   Basic Metabolic Panel: Recent Labs  Lab 07-25-2018 1427 07-25-2018 2347 07/26/2018 0530 07/16/18 0543  NA 140  --  141 140  K 4.4  --  5.2* 4.5  CL 104  --  107 109  CO2 28  --  28 29  GLUCOSE 135*  --  145* 150*  BUN 21  --  42* 42*  CREATININE 0.73  --  0.80 1.01  CALCIUM 9.0  --  8.3* 7.9*  MG  --  2.0  --   --    GFR: Estimated Creatinine Clearance: 49.9 mL/min (by C-G formula based on SCr  of 1.01 mg/dL). Liver Function Tests: Recent Labs  Lab 07/16/2018 1427 2018-08-13 0530  AST 17 14*  ALT 16 13  ALKPHOS 56 43  BILITOT 0.4 0.5  PROT 6.5 5.6*  ALBUMIN 3.2* 2.6*   No results for input(s): LIPASE, AMYLASE in the last 168 hours. No results for input(s): AMMONIA in the last 168 hours. Coagulation Profile: No results for input(s): INR, PROTIME in the last 168 hours. Cardiac Enzymes: No results for input(s): CKTOTAL, CKMB, CKMBINDEX, TROPONINI in the last 168 hours. BNP (last 3 results) No results for input(s): PROBNP in the last 8760 hours. HbA1C: No results for input(s): HGBA1C in the last 72 hours. CBG: Recent Labs  Lab 2018/08/13 1609 August 13, 2018 2002 07/16/18 0003 07/16/18 0401 07/16/18 0751  GLUCAP 135* 122* 147* 149* 124*   Lipid Profile: No results for input(s): CHOL, HDL, LDLCALC, TRIG, CHOLHDL, LDLDIRECT in the last 72 hours. Thyroid Function Tests: No results for input(s): TSH, T4TOTAL, FREET4, T3FREE, THYROIDAB in the last 72 hours. Anemia Panel: No results for input(s): VITAMINB12, FOLATE, FERRITIN, TIBC, IRON, RETICCTPCT in the last 72 hours. Sepsis Labs: No results for input(s): PROCALCITON, LATICACIDVEN in the last 168 hours.  Recent Results (from the past 240 hour(s))  MRSA PCR Screening     Status: Abnormal   Collection Time: Aug 13, 2018 12:24 AM  Result Value Ref Range Status   MRSA by PCR POSITIVE (A) NEGATIVE Final    Comment:        The GeneXpert MRSA Assay (FDA approved for NASAL specimens only), is one component of a comprehensive MRSA colonization surveillance program. It is not intended to diagnose MRSA infection nor to guide or monitor treatment for MRSA infections. RESULT CALLED TO, READ BACK BY AND VERIFIED WITH: BRITTNEY @ 0851 ON [id number] BY HENDERSON L. Performed at [facility], [address], [place], [place] [address]          Radiology Studies: No results found.      Scheduled Meds: . LORazepam  1 mg  Intravenous Q8H  . morphine CONCENTRATE  2.6 mg Sublingual Q4H   Continuous Infusions:   LOS: 3 days    Time spent: 30 minutes    Esaiah Wanless Hoover Brunette, DO [facility] Pager [phone]  If 7PM-7AM, please contact night-coverage [email or web address] Password [email or web address] 07/18/2018, 2:56 PM

## 2018-07-18 NOTE — Progress Notes (Signed)
Patient has a temp of 102.8 and an oxygen sat if 77%. This RN discussed the option of tylenol suppository and nasal cannula with the family, to which they declined stating that they do not want to compromise his comfort in any way. Will continue to monitor.  Quita Skye, RN

## 2018-07-19 MED ORDER — MORPHINE SULFATE (CONCENTRATE) 10 MG/0.5ML PO SOLN
5.0000 mg | ORAL | Status: DC
  Administered 2018-07-19 (×2): 5 mg via SUBLINGUAL
  Filled 2018-07-19 (×2): qty 0.5

## 2018-07-19 MED ORDER — LORAZEPAM 2 MG/ML IJ SOLN
1.0000 mg | Freq: Four times a day (QID) | INTRAMUSCULAR | Status: DC
  Administered 2018-07-19: 1 mg via INTRAVENOUS
  Filled 2018-07-19: qty 1

## 2018-08-06 NOTE — Care Management Important Message (Signed)
Important Message  Patient Details  Name: Juan Patel MRN: [id number] Date of Birth: [birth year]   Medicare Important Message Given:  Yes Gave letter to NCM pt. on precautions.   Sloan Leiter Smith 07/24/2018, 10:34 AM

## 2018-08-06 NOTE — Progress Notes (Signed)
PROGRESS NOTE    Juan Patel  RUE:[id number] DOB: [birth year] DOA: 07/17/2018 PCP: Joette Catching, MD   Brief Narrative:   This is a 84 year old Caucasian male with history of type 2 diabetes, hypertension, dyslipidemia, CAD status post CABG, and AAA who presented from [facility] with hematemesis and aspiration pneumonia. He has been started on Protonix drip and has been seen by GI who will consider upper endoscopy in the near future. He has been started on IV Zosyn for his pneumonia. He did have an episode of A. fib with RVR last night which has now resolved and he is currently in sinus rhythm at 90 bpm.  Family members have decided to pursue comfort measures and palliative care will be involved.  Assessment & Plan:  Principal Problem: N&V (nausea and vomiting) Active Problems: Anemia  1. Acute blood loss anemia-secondary to hematemesis. Suspect upper GI bleed. Appreciate gastroenterology consultation.Poor candidate for EGD and family members agree.Endoscopy avoided and will continue palliative care. Poor prognosis for survival. 2. Aspiration pneumonia-questionable on left side.DiscontinuedZosyn on 10/11on account of the fact that patient will be comfort measures. 3. Transient A. fib with RVR. Discontinued anticoagulation and metoprolol. 4. CAD status post CABG.Discontinued aspirin, metoprolol, and Lipitor. 5. Type 2 diabetes.Discontinue sliding scale insulin and metformin. 6. Dementia.Discontinue Namenda. 7. BPH. Continue Flomax dailyfor comfort.  Continue comfort measures.  DVT prophylaxis:SCDs Code Status:DNR Family Communication:Spoke with wife, son, and daughter at bedside. Disposition Plan:Comfort measures   Consultants:  GI  Palliative Care  Procedures:  None  Antimicrobials:  Zosyn 10/9->10/11  Subjective: Patient seen and evaluated today with no new acute complaints or concerns. No acute concerns  or events noted overnight.He appears to be comfortably resting while on hospice protocol.  Objective: Vitals:   07/17/18 2307 07/18/18 0500 07/18/18 1207 07/16/2018 0644  BP:      Pulse: (!) 116 (!) 120 (!) 131 (!) 139  Resp: (!) 28 (!) 30  (!) 25  Temp: (!) 101.3 F (38.5 C) (!) 102.5 F (39.2 C) (!) 102.8 F (39.3 C) (!) 102.8 F (39.3 C)  TempSrc: Axillary Axillary Axillary Oral  SpO2: (!) 88% (!) 86% (!) 77% (!) 66%  Weight:      Height:        Intake/Output Summary (Last 24 hours) at 07/29/2018 1547 Last data filed at 07/18/2018 1700 Gross per 24 hour  Intake -  Output 600 ml  Net -600 ml   Filed Weights   08-06-2018 0451 08-06-2018 1328 07/16/18 0410  Weight: 71.1 kg 71.1 kg 71.2 kg    Examination:  General exam: Appears calm and comfortable, somnolent Respiratory system: Clear to auscultation. Respiratory effort normal. Cardiovascular system: S1 & S2 heard, RRR. No JVD, murmurs, rubs, gallops or clicks. No pedal edema. Gastrointestinal system: Abdomen is nondistended, soft and nontender. No organomegaly or masses felt. Normal bowel sounds heard. Central nervous system: Cannot be assessed. Extremities: Symmetric 5 x 5 power. Skin: No rashes, lesions or ulcers Psychiatry: Cannot be assessed.    Data Reviewed: I have personally reviewed following labs and imaging studies  CBC: Recent Labs  Lab 07/18/2018 1427 07/07/2018 2347 2018-08-06 0530 07/16/18 0543  WBC 13.6* 18.4* 10.4 11.8*  NEUTROABS 8.5*  --   --   --   HGB 12.0* 11.1* 9.9* 7.1*  HCT 38.2* 36.1* 31.4* 22.8*  MCV 102.4* 103.4* 105.0* 102.7*  PLT 238 242 228 174   Basic Metabolic Panel: Recent Labs  Lab 07/20/2018 1427 07/26/2018 2347 Aug 06, 2018 0530 07/16/18  0543  NA 140  --  141 140  K 4.4  --  5.2* 4.5  CL 104  --  107 109  CO2 28  --  28 29  GLUCOSE 135*  --  145* 150*  BUN 21  --  42* 42*  CREATININE 0.73  --  0.80 1.01  CALCIUM 9.0  --  8.3* 7.9*  MG  --  2.0  --   --    GFR: Estimated  Creatinine Clearance: 49.9 mL/min (by C-G formula based on SCr of 1.01 mg/dL). Liver Function Tests: Recent Labs  Lab 28-Jul-2018 1427 07/28/2018 0530  AST 17 14*  ALT 16 13  ALKPHOS 56 43  BILITOT 0.4 0.5  PROT 6.5 5.6*  ALBUMIN 3.2* 2.6*   No results for input(s): LIPASE, AMYLASE in the last 168 hours. No results for input(s): AMMONIA in the last 168 hours. Coagulation Profile: No results for input(s): INR, PROTIME in the last 168 hours. Cardiac Enzymes: No results for input(s): CKTOTAL, CKMB, CKMBINDEX, TROPONINI in the last 168 hours. BNP (last 3 results) No results for input(s): PROBNP in the last 8760 hours. HbA1C: No results for input(s): HGBA1C in the last 72 hours. CBG: Recent Labs  Lab 07/17/2018 1609 08/05/2018 2002 07/16/18 0003 07/16/18 0401 07/16/18 0751  GLUCAP 135* 122* 147* 149* 124*   Lipid Profile: No results for input(s): CHOL, HDL, LDLCALC, TRIG, CHOLHDL, LDLDIRECT in the last 72 hours. Thyroid Function Tests: No results for input(s): TSH, T4TOTAL, FREET4, T3FREE, THYROIDAB in the last 72 hours. Anemia Panel: No results for input(s): VITAMINB12, FOLATE, FERRITIN, TIBC, IRON, RETICCTPCT in the last 72 hours. Sepsis Labs: No results for input(s): PROCALCITON, LATICACIDVEN in the last 168 hours.  Recent Results (from the past 240 hour(s))  MRSA PCR Screening     Status: Abnormal   Collection Time: 07/24/2018 12:24 AM  Result Value Ref Range Status   MRSA by PCR POSITIVE (A) NEGATIVE Final    Comment:        The GeneXpert MRSA Assay (FDA approved for NASAL specimens only), is one component of a comprehensive MRSA colonization surveillance program. It is not intended to diagnose MRSA infection nor to guide or monitor treatment for MRSA infections. RESULT CALLED TO, READ BACK BY AND VERIFIED WITH: BRITTNEY @ 0851 ON [id number] BY HENDERSON L. Performed at [facility], [address], [place], [place] [address]          Radiology Studies: No  results found.      Scheduled Meds: . LORazepam  1 mg Intravenous Q6H  . morphine CONCENTRATE  5 mg Sublingual Q4H   Continuous Infusions:   LOS: 4 days    Time spent: 30 minutes    Norrine Ballester Hoover Brunette, DO [facility] Pager [phone]  If 7PM-7AM, please contact night-coverage [email or web address] Password [email or web address] 07/17/2018, 3:47 PM

## 2018-08-06 NOTE — Discharge Summary (Signed)
Physician Discharge Summary  Juan Patel:[id number] DOB: July 31, 1926 DOA: 07/25/2018  PCP: Joette Catching, MD  Admit date: 07/29/2018  Discharge date: 07-26-18  Admitted From:Home  Disposition:  Expired  Brief/Interim Summary:  This is a 84 year old Caucasian male with history of type 2 diabetes, hypertension, dyslipidemia, CAD status post CABG, and AAA who presented from [facility] with hematemesis and aspiration pneumonia. He had been started on Protonix drip and was seen by GI who ultimately decided that it would be best not to perform endoscopy and family members agreed. He was also on IV Zosyn for aspiration pneumonia.  Family members have decided to pursue comfort measures on 10/10 and palliative care was consulted. Zosyn and Protonix were discontinued the same day and patient remained on comfort measures until he expired on 10/14 at 1713.  Discharge Diagnoses:  Principal Problem:   N&V (nausea and vomiting) Active Problems:   Anemia   Nausea and vomiting   Aspiration pneumonia (HCC)   Goals of care, counseling/discussion   Palliative care by specialist   Encounter for hospice care discussion   End of life care   Consultations:  GI  Palliative Care   Procedures/Studies: Dg Chest Port 1 View  Result Date: 07/23/2018 CLINICAL DATA:  Hemoptysis. EXAM: PORTABLE CHEST 1 VIEW COMPARISON:  10/03/2016 FINDINGS: Low lung volumes with patchy left basilar airspace opacities suspicious for pneumonia and atelectasis. No overt pulmonary edema. No effusion or pneumothorax. Status post CABG. Moderate aortic atherosclerosis at the arch without aneurysmal dilatation is noted. Slight elevation of right hemidiaphragm. No acute osseous appearing abnormality. IMPRESSION: Patchy consolidations at the left lung base suspicious for pneumonia and/or atelectasis. Aortic atherosclerosis without aneurysmal dilatation. Electronically Signed   By: Tollie Eth M.D.   On: 07/18/2018  15:40    Microbiology: Recent Results (from the past 240 hour(s))  MRSA PCR Screening     Status: Abnormal   Collection Time: 07/17/2018 12:24 AM  Result Value Ref Range Status   MRSA by PCR POSITIVE (A) NEGATIVE Final    Comment:        The GeneXpert MRSA Assay (FDA approved for NASAL specimens only), is one component of a comprehensive MRSA colonization surveillance program. It is not intended to diagnose MRSA infection nor to guide or monitor treatment for MRSA infections. RESULT CALLED TO, READ BACK BY AND VERIFIED WITH: BRITTNEY @ 0851 ON [id number] BY HENDERSON L. Performed at [facility], [address] [place], [place] [address]      Labs: BNP (last 3 results) No results for input(s): BNP in the last 8760 hours. Basic Metabolic Panel: Recent Labs  Lab 07/17/2018 1427 07/26/2018 2347 07/27/2018 0530 07/16/18 0543  NA 140  --  141 140  K 4.4  --  5.2* 4.5  CL 104  --  107 109  CO2 28  --  28 29  GLUCOSE 135*  --  145* 150*  BUN 21  --  42* 42*  CREATININE 0.73  --  0.80 1.01  CALCIUM 9.0  --  8.3* 7.9*  MG  --  2.0  --   --    Liver Function Tests: Recent Labs  Lab 07/27/2018 1427 07/28/2018 0530  AST 17 14*  ALT 16 13  ALKPHOS 56 43  BILITOT 0.4 0.5  PROT 6.5 5.6*  ALBUMIN 3.2* 2.6*   No results for input(s): LIPASE, AMYLASE in the last 168 hours. No results for input(s): AMMONIA in the last 168 hours. CBC: Recent Labs  Lab  07/07/2018 1427 07/09/2018 2347 Jul 26, 2018 0530 07/16/18 0543  WBC 13.6* 18.4* 10.4 11.8*  NEUTROABS 8.5*  --   --   --   HGB 12.0* 11.1* 9.9* 7.1*  HCT 38.2* 36.1* 31.4* 22.8*  MCV 102.4* 103.4* 105.0* 102.7*  PLT 238 242 228 174   Cardiac Enzymes: No results for input(s): CKTOTAL, CKMB, CKMBINDEX, TROPONINI in the last 168 hours. BNP: Invalid input(s): POCBNP CBG: Recent Labs  Lab 07-26-2018 1609 2018/07/26 2002 07/16/18 0003 07/16/18 0401 07/16/18 0751  GLUCAP 135* 122* 147* 149* 124*   D-Dimer No results for input(s):  DDIMER in the last 72 hours. Hgb A1c No results for input(s): HGBA1C in the last 72 hours. Lipid Profile No results for input(s): CHOL, HDL, LDLCALC, TRIG, CHOLHDL, LDLDIRECT in the last 72 hours. Thyroid function studies No results for input(s): TSH, T4TOTAL, T3FREE, THYROIDAB in the last 72 hours.  Invalid input(s): FREET3 Anemia work up No results for input(s): VITAMINB12, FOLATE, FERRITIN, TIBC, IRON, RETICCTPCT in the last 72 hours. Urinalysis    Component Value Date/Time   COLORURINE YELLOW 08/14/2014 1433   APPEARANCEUR CLOUDY (A) 08/14/2014 1433   LABSPEC 1.026 08/14/2014 1433   PHURINE 5.5 08/14/2014 1433   GLUCOSEU 500 (A) 08/14/2014 1433   HGBUR NEGATIVE 08/14/2014 1433   BILIRUBINUR NEGATIVE 08/14/2014 1433   KETONESUR NEGATIVE 08/14/2014 1433   PROTEINUR NEGATIVE 08/14/2014 1433   UROBILINOGEN 0.2 08/14/2014 1433   NITRITE NEGATIVE 08/14/2014 1433   LEUKOCYTESUR NEGATIVE 08/14/2014 1433   Sepsis Labs Invalid input(s): PROCALCITONIN,  WBC,  LACTICIDVEN Microbiology Recent Results (from the past 240 hour(s))  MRSA PCR Screening     Status: Abnormal   Collection Time: 2018/07/26 12:24 AM  Result Value Ref Range Status   MRSA by PCR POSITIVE (A) NEGATIVE Final    Comment:        The GeneXpert MRSA Assay (FDA approved for NASAL specimens only), is one component of a comprehensive MRSA colonization surveillance program. It is not intended to diagnose MRSA infection nor to guide or monitor treatment for MRSA infections. RESULT CALLED TO, READ BACK BY AND VERIFIED WITH: BRITTNEY @ 0851 ON [id number] BY HENDERSON L. Performed at [facility], [address], [place], [place] [address]      Time coordinating discharge: 25 minutes  SIGNED:   Erick Blinks, DO [facility] 07/28/2018, 6:01 PM Pager [phone]  If 7PM-7AM, please contact night-coverage [email or web address] Password [email or web address]

## 2018-08-06 NOTE — Progress Notes (Signed)
Palliative: Juan Patel is lying quietly in bed surrounded by his family.  He appears relatively comfortable today although his respiratory rate is a little high.  Present today at bedside his daughter Juan Patel and wife Juan Patel.  We talked about fever, comfort measures, family encouraged to ask for anything they feel Juan Patel.  Family is agreeable to increase sublingual morphine for increased respiratory rate. Conference with Juan Patel in the hallway.  She shares that she had some concern about length of stay in the hospital, insurance payment.  I share that Juan Patel has not been stable for transport, and he does Patel specialized medical care here in the hospital.  Prognosis discussed with permission, anticipate less than 24 hours. Conference with nursing staff related to increase medication regimen, needs. Conference with hospitalist related to increase medication regimen, prognosis. 35 minutes Juan Patel, Juan Patel Palliative Medicine Team Team Phone # [phone]  Greater than 50% of this time was spent counseling and coordinating care related to the above assessment and plan.

## 2018-08-06 DEATH — deceased
